# Patient Record
Sex: Male | Born: 1990
Health system: Southern US, Community
[De-identification: ages and names within clinical notes are randomized; demographics above are authoritative.]

## PROBLEM LIST (undated history)

## (undated) DIAGNOSIS — R002 Palpitations: Secondary | ICD-10-CM

## (undated) DIAGNOSIS — Q249 Congenital malformation of heart, unspecified: Secondary | ICD-10-CM

## (undated) DIAGNOSIS — K824 Cholesterolosis of gallbladder: Secondary | ICD-10-CM

## (undated) DIAGNOSIS — I10 Essential (primary) hypertension: Secondary | ICD-10-CM

## (undated) DIAGNOSIS — F419 Anxiety disorder, unspecified: Secondary | ICD-10-CM

## (undated) HISTORY — DX: Anxiety disorder, unspecified: F41.9

## (undated) HISTORY — DX: Congenital malformation of heart, unspecified: Q24.9

## (undated) HISTORY — DX: Cholesterolosis of gallbladder: K82.4

---

## 2008-09-05 ENCOUNTER — Emergency Department (HOSPITAL_BASED_OUTPATIENT_CLINIC_OR_DEPARTMENT_OTHER): Admission: EM | Admit: 2008-09-05 | Discharge: 2008-09-05 | Payer: Self-pay | Admitting: Emergency Medicine

## 2009-02-13 ENCOUNTER — Emergency Department (HOSPITAL_BASED_OUTPATIENT_CLINIC_OR_DEPARTMENT_OTHER): Admission: EM | Admit: 2009-02-13 | Discharge: 2009-02-14 | Payer: Self-pay | Admitting: Emergency Medicine

## 2010-11-18 ENCOUNTER — Emergency Department (HOSPITAL_BASED_OUTPATIENT_CLINIC_OR_DEPARTMENT_OTHER)
Admission: EM | Admit: 2010-11-18 | Discharge: 2010-11-18 | Disposition: A | Discharge status: Home / Self Care | Payer: No Typology Code available for payment source | Attending: Emergency Medicine | Admitting: Emergency Medicine

## 2010-11-18 ENCOUNTER — Encounter: Payer: Self-pay | Admitting: *Deleted

## 2010-11-18 DIAGNOSIS — M549 Dorsalgia, unspecified: Secondary | ICD-10-CM | POA: Insufficient documentation

## 2010-11-18 DIAGNOSIS — S39012A Strain of muscle, fascia and tendon of lower back, initial encounter: Secondary | ICD-10-CM

## 2010-11-18 DIAGNOSIS — Y92410 Unspecified street and highway as the place of occurrence of the external cause: Secondary | ICD-10-CM | POA: Insufficient documentation

## 2010-11-18 DIAGNOSIS — J45909 Unspecified asthma, uncomplicated: Secondary | ICD-10-CM | POA: Insufficient documentation

## 2010-11-18 MED ORDER — CYCLOBENZAPRINE HCL 10 MG PO TABS: 10.0000 mg | ORAL_TABLET | Freq: Two times a day (BID) | ORAL | Status: AC | PRN
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
## 2011-03-20 ENCOUNTER — Encounter (HOSPITAL_COMMUNITY): Payer: Self-pay | Admitting: Emergency Medicine

## 2011-03-20 ENCOUNTER — Emergency Department (HOSPITAL_COMMUNITY)
Admission: EM | Admit: 2011-03-20 | Discharge: 2011-03-21 | Disposition: A | Discharge status: Home / Self Care | Payer: BC Managed Care – PPO | Attending: Emergency Medicine | Admitting: Emergency Medicine

## 2011-03-20 ENCOUNTER — Other Ambulatory Visit: Payer: Self-pay

## 2011-03-20 DIAGNOSIS — R002 Palpitations: Secondary | ICD-10-CM | POA: Insufficient documentation

## 2011-03-20 DIAGNOSIS — R209 Unspecified disturbances of skin sensation: Secondary | ICD-10-CM | POA: Insufficient documentation

## 2011-03-20 DIAGNOSIS — R0602 Shortness of breath: Secondary | ICD-10-CM | POA: Insufficient documentation

## 2011-03-20 NOTE — ED Notes (Signed)
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

## 2011-03-20 NOTE — ED Notes (Signed)
Pt states he was watching tv and his breathing became shallow then his heart rate increased and he became clammy and he felt nauseated  Pt states EMS came out and assessed him and his blood pressure was elevated  Pt came in by POV   Pt states he feels better now but feels more tired than normal  Pt states he did eat tonight  EKG obtained in triage

## 2011-03-21 ENCOUNTER — Other Ambulatory Visit: Payer: Self-pay

## 2011-03-21 LAB — CBC
MCV: 82.4 fL (ref 78.0–100.0)
Platelets: 188 10*3/uL (ref 150–400)
RDW: 11.9 % (ref 11.5–15.5)
WBC: 10.5 10*3/uL (ref 4.0–10.5)

## 2011-03-21 LAB — BASIC METABOLIC PANEL
Calcium: 10 mg/dL (ref 8.4–10.5)
Chloride: 101 mEq/L (ref 96–112)
Creatinine, Ser: 0.98 mg/dL (ref 0.50–1.35)
GFR calc Af Amer: >90 mL/min (ref >90)
GFR calc non Af Amer: >90 mL/min (ref >90)

## 2011-03-21 LAB — RAPID URINE DRUG SCREEN, HOSP PERFORMED
Amphetamines: NOT DETECTED
Opiates: NOT DETECTED

## 2011-03-21 NOTE — ED Provider Notes (Signed)
History     CSN: [id number]  Arrival date & time 03/20/11  2239   First MD Initiated Contact with Patient 03/21/11 0231      No chief complaint on file.   (Consider location/radiation/quality/duration/timing/severity/associated sxs/prior treatment) The history is provided by the patient.   patient reports he was in his normal state health today laying on the couch watching TV when he developed sudden palpitations and a rapid heart rate.  He made him short of breath.  He also had sweating and numbness in his bilateral hands.  He does report significant use of caffeine which includes multiple caffeinated sodas daily as well as an organic energy during.  He does have a family history of early heart disease on his father's side with MIs in their mid 18s.  There is no family history of early death or sudden cardiac death.  At time of my evaluation the patient reports he feels completely back to normal.  He has no palpitations chest pain or shortness of breath at this time.  He has had no recent long travel.  He denies to lower leg swelling.  He does not smoke cigarettes.  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  The patient several palpitations before.  Past Medical History  Diagnosis Date  . Asthma     History reviewed. No pertinent past surgical history.  History reviewed. No pertinent family history.  History  Substance Use Topics  . Smoking status: Never Smoker   . Smokeless tobacco: Not on file  . Alcohol Use: Yes     occassional      Review of Systems  All other systems reviewed and are negative.    Allergies  Review of patient's allergies indicates no known allergies.  Home Medications   Current Outpatient Rx  Name Route Sig Dispense Refill  . ACETAMINOPHEN 325 MG PO TABS Oral Take 650 mg by mouth  every 6 (six) hours as needed. For pain    . ALBUTEROL SULFATE HFA 108 (90 BASE) MCG/ACT IN AERS Inhalation Inhale 2 puffs into the lungs every 6 (six) hours as needed. For asthma      BP 177/93  Pulse 142  Temp(Src) 98.5 F (36.9 C) (Oral)  Resp 18  SpO2 98%  Physical Exam  Nursing note and vitals reviewed. Constitutional: He is oriented to person, place, and time. He appears well-developed and well-nourished.  HENT:  Head: Normocephalic and atraumatic.  Eyes: EOM are normal.  Neck: Normal range of motion.  Cardiovascular: Normal rate, regular rhythm, normal heart sounds and intact distal pulses.   Pulmonary/Chest: Effort normal and breath sounds normal. No respiratory distress.  Abdominal: Soft. He exhibits no distension. There is no tenderness.  Musculoskeletal: Normal range of motion.  Neurological: He is alert and oriented to person, place, and time.  Skin: Skin is warm and dry.  Psychiatric: He has a normal mood and affect. Judgment normal.    ED Course  Procedures (including critical care time)   Date: 03/20/2011 2256  Rate: 100  Rhythm: Sinus tachycardia  QRS Axis: normal  Intervals: normal  ST/T Wave abnormalities: Nonspecific ST changes  Conduction Disutrbances: none  Narrative Interpretation:   Old EKG Reviewed: No prior EKG available   Date: 03/21/2011 0323  Rate: 69  Rhythm: normal sinus rhythm  QRS Axis: normal  Intervals: normal  ST/T Wave abnormalities: normal  Conduction Disutrbances: none  Narrative Interpretation:   Old EKG Reviewed: Sinus tachycardia is no longer present.    Labs Reviewed  CBC - Abnormal; Notable for the following:    MCHC 37.6 (*) RULED OUT INTERFERING SUBSTANCES   All other components within normal limits  BASIC METABOLIC PANEL - Abnormal; Notable for the following:    Glucose, Bld 125 (*)    All other components within normal limits  URINE RAPID DRUG SCREEN (HOSP PERFORMED)   No results found.   1. Palpitations         MDM  The patient presented with a heart rate of 142.  No EKG was obtained at this rate.  His only EKG obtained at a high rate with a rate of 100..  Nonspecific ST changes.  I suspect he may have had atrial fibrillation or atrial flutter although it is a very healthy young gentleman.  He does use a significant amount of caffeine daily and I suspect this is leading to his palpitations and may have resulted in atrial fibrillation or atrial flutter.  At time of my initial evaluation and throughout the remainder of his ER stay he remained in sinus rhythm.  His discharge heart rate was 69.Marland Kitchen  He has followup with a cardiologist scheduled for 2 days secondary to a possible murmur heard while in nursing school.  At this time I will hold placing the patient on calcium channel blocker or beta blocker and will defer this to cardiology.  She nothing to suggest Wolff-Parkinson-White syndrome.  Patient said no exertional syncope or exertional chest pain or shortness of breath.  He went for a run with his dog earlier today and had no difficulties        Lyanne Co, MD 03/21/11 [phone]

## 2011-04-05 ENCOUNTER — Other Ambulatory Visit (HOSPITAL_COMMUNITY): Payer: Self-pay | Admitting: Cardiovascular Disease

## 2011-04-05 DIAGNOSIS — R825 Elevated urine levels of drugs, medicaments and biological substances: Secondary | ICD-10-CM

## 2011-04-12 ENCOUNTER — Other Ambulatory Visit (HOSPITAL_COMMUNITY): Payer: Self-pay | Admitting: Cardiovascular Disease

## 2011-04-12 ENCOUNTER — Ambulatory Visit (HOSPITAL_COMMUNITY)
Admission: RE | Admit: 2011-04-12 | Discharge: 2011-04-12 | Disposition: A | Discharge status: Home / Self Care | Payer: BC Managed Care – PPO | Source: Ambulatory Visit | Attending: Cardiovascular Disease | Admitting: Cardiovascular Disease

## 2011-04-12 ENCOUNTER — Encounter (HOSPITAL_COMMUNITY): Payer: Self-pay

## 2011-04-12 DIAGNOSIS — R82998 Other abnormal findings in urine: Secondary | ICD-10-CM | POA: Insufficient documentation

## 2011-04-12 DIAGNOSIS — R825 Elevated urine levels of drugs, medicaments and biological substances: Secondary | ICD-10-CM

## 2011-04-12 DIAGNOSIS — K824 Cholesterolosis of gallbladder: Secondary | ICD-10-CM | POA: Insufficient documentation

## 2011-04-15 ENCOUNTER — Encounter (HOSPITAL_BASED_OUTPATIENT_CLINIC_OR_DEPARTMENT_OTHER): Payer: Self-pay | Admitting: *Deleted

## 2011-04-15 ENCOUNTER — Emergency Department (HOSPITAL_BASED_OUTPATIENT_CLINIC_OR_DEPARTMENT_OTHER)
Admission: EM | Admit: 2011-04-15 | Discharge: 2011-04-15 | Disposition: A | Discharge status: Home / Self Care | Payer: BC Managed Care – PPO | Attending: Emergency Medicine | Admitting: Emergency Medicine

## 2011-04-15 DIAGNOSIS — W57XXXA Bitten or stung by nonvenomous insect and other nonvenomous arthropods, initial encounter: Secondary | ICD-10-CM | POA: Insufficient documentation

## 2011-04-15 DIAGNOSIS — S40269A Insect bite (nonvenomous) of unspecified shoulder, initial encounter: Secondary | ICD-10-CM | POA: Insufficient documentation

## 2011-04-15 DIAGNOSIS — IMO0002 Reserved for concepts with insufficient information to code with codable children: Secondary | ICD-10-CM

## 2011-04-15 DIAGNOSIS — I10 Essential (primary) hypertension: Secondary | ICD-10-CM | POA: Insufficient documentation

## 2011-04-15 HISTORY — DX: Essential (primary) hypertension: I10

## 2011-04-15 HISTORY — DX: Palpitations: R00.2

## 2011-04-15 NOTE — Discharge Instructions (Signed)
Insect Bite Mosquitoes, flies, fleas, bedbugs, and many other insects can bite. Insect bites are different from insect stings. A sting is when venom is injected into the skin. Some insect bites can transmit infectious diseases. SYMPTOMS  Insect bites usually turn red, swell, and itch for 2 to 4 days. They often go away on their own. TREATMENT  Your caregiver may prescribe antibiotic medicines if a bacterial infection develops in the bite. HOME CARE INSTRUCTIONS  Do not scratch the bite area.   Keep the bite area clean and dry. Wash the bite area thoroughly with soap and water.   Put ice or cool compresses on the bite area.   Put ice in a plastic bag.   Place a towel between your skin and the bag.   Leave the ice on for 20 minutes, 4 times a day for the first 2 to 3 days, or as directed.   You may apply a baking soda paste, cortisone cream, or calamine lotion to the bite area as directed by your caregiver. This can help reduce itching and swelling.   Only take over-the-counter or prescription medicines as directed by your caregiver.   If you are given antibiotics, take them as directed. Finish them even if you start to feel better.  You may need a tetanus shot if:  You cannot remember when you had your last tetanus shot.   You have never had a tetanus shot.   The injury broke your skin.  If you get a tetanus shot, your arm may swell, get red, and feel warm to the touch. This is common and not a problem. If you need a tetanus shot and you choose not to have one, there is a rare chance of getting tetanus. Sickness from tetanus can be serious. SEEK IMMEDIATE MEDICAL CARE IF:   You have increased pain, redness, or swelling in the bite area.   You see a red line on the skin coming from the bite.   You have a fever.   You have joint pain.   You have a headache or neck pain.   You have unusual weakness.   You have a rash.   You have chest pain or shortness of breath.   You  have abdominal pain, nausea, or vomiting.   You feel unusually tired or sleepy.  MAKE SURE YOU:   Understand these instructions.   Will watch your condition.   Will get help right away if you are not doing well or get worse.  Document Released: 02/03/2004 Document Revised: 12/15/2010 Document Reviewed: 07/27/2010 Exit[employer] Patient Information 2012 [employer]. 

## 2011-04-15 NOTE — ED Notes (Addendum)
Fine red rash to right upper arm onset today. Was outside yesterday. States has had some neck discomfort and tension headaches for a couple weeks.

## 2011-04-15 NOTE — ED Provider Notes (Signed)
History     CSN: [id number]  Arrival date & time 04/15/11  2106   First MD Initiated Contact with Patient 04/15/11 2142      Chief Complaint  Patient presents with  . Rash    (Consider location/radiation/quality/duration/timing/severity/associated sxs/prior treatment) Patient is a 21 y.o. male presenting with rash. The history is provided by the patient. No language interpreter was used.  Rash  This is a new problem. The current episode started 6 to 12 hours ago. The problem has been gradually worsening. The problem is associated with an insect bite/sting. There has been no fever. The rash is present on the right arm. The patient is experiencing no pain. The pain has been constant since onset. He has tried nothing for the symptoms. The treatment provided no relief. Risk factors include new medications.  Pt reports he had a bump come up on his arm.  Pt thinks it may be an insect bite.  Pt did not see a tick  Past Medical History  Diagnosis Date  . Asthma   . Hypertension   . Palpitations     History reviewed. No pertinent past surgical history.  History reviewed. No pertinent family history.  History  Substance Use Topics  . Smoking status: Never Smoker   . Smokeless tobacco: Not on file  . Alcohol Use: Yes     occassional      Review of Systems  Skin: Positive for rash.  All other systems reviewed and are negative.    Allergies  Review of patient's allergies indicates no known allergies.  Home Medications   Current Outpatient Rx  Name Route Sig Dispense Refill  . ACETAMINOPHEN 325 MG PO TABS Oral Take 650 mg by mouth every 6 (six) hours as needed. For pain    . BUSPIRONE HCL 5 MG PO TABS Oral Take 5 mg by mouth 3 (three) times daily.    Marland Kitchen METOPROLOL SUCCINATE ER 25 MG PO TB24 Oral Take 25 mg by mouth daily.      BP 144/83  Pulse 56  Temp(Src) 97.9 F (36.6 C) (Oral)  Resp 18  Ht 5\' 4"  (1.626 m)  Wt 120 lb (54.432 kg)  BMI 20.60 kg/m2  SpO2  99%  Physical Exam  Constitutional: He appears well-developed and well-nourished.  HENT:  Head: Normocephalic and atraumatic.  Mouth/Throat: Oropharynx is clear and moist.  Eyes: Conjunctivae are normal. Pupils are equal, round, and reactive to light.  Neck: Normal range of motion.  Pulmonary/Chest: Effort normal.  Abdominal: Soft.  Musculoskeletal: Normal range of motion.       1cm round area looks like a bug bite  Neurological: He is alert.  Skin: There is erythema.    ED Course  Procedures (including critical care time)  Labs Reviewed - No data to display No results found.   1. Bug bite of shoulder or upper arm       MDM  Pt counseled on need to return if fever, headache or other sign of tick illness.   Topical hydrocortisone to area        Lonia Skinner McIntosh, [place] 04/15/11 2220

## 2011-04-17 NOTE — ED Provider Notes (Signed)
History/physical exam/procedure(s) were performed by non-physician practitioner and as supervising physician I was immediately available for consultation/collaboration. I have reviewed all notes and am in agreement with care and plan.   Hilario Quarry, MD 04/17/11 1323

## 2011-05-15 ENCOUNTER — Encounter: Payer: Self-pay | Admitting: Family Medicine

## 2011-05-15 ENCOUNTER — Ambulatory Visit (INDEPENDENT_AMBULATORY_CARE_PROVIDER_SITE_OTHER): Payer: No Typology Code available for payment source | Admitting: Family Medicine

## 2011-05-15 VITALS — BP 115/75 | HR 114 | Temp 99.0°F | Ht 65.0 in | Wt 117.8 lb

## 2011-05-15 DIAGNOSIS — F419 Anxiety disorder, unspecified: Secondary | ICD-10-CM

## 2011-05-15 DIAGNOSIS — F411 Generalized anxiety disorder: Secondary | ICD-10-CM

## 2011-05-15 DIAGNOSIS — R825 Elevated urine levels of drugs, medicaments and biological substances: Secondary | ICD-10-CM | POA: Insufficient documentation

## 2011-05-15 DIAGNOSIS — R634 Abnormal weight loss: Secondary | ICD-10-CM | POA: Insufficient documentation

## 2011-05-15 DIAGNOSIS — J45909 Unspecified asthma, uncomplicated: Secondary | ICD-10-CM

## 2011-05-15 DIAGNOSIS — R82998 Other abnormal findings in urine: Secondary | ICD-10-CM

## 2011-05-15 MED ORDER — BUSPIRONE HCL 15 MG PO TABS: ORAL_TABLET | ORAL | Status: DC

## 2011-05-15 NOTE — Progress Notes (Signed)
  Subjective:    Patient ID: Miguel Love, male    DOB: [birth year], 21 y.o.   MRN: [id number]  HPI New to establish.  Previous MD- Donnie Coffin (Peds), Stefan Church (Allergist- retired).  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  Not on daily controller med.  Doing well.  Arrhythmia- had episode of SOB and racing heart over spring break.  Went to ER.  EKG was normal.  Stopped caffeine intake.  Had normal ECHO.  Prior to this had no hx of anxiety or panic attacks.  Has not had an episode for last 3 weeks.  Started Metoprolol beginning of April.  Had exercise stress test that showed elevated metanephrines.  Had w/u for pheo- negative.  Has upcoming appt w/ Endo ([facility])  Anxiety- reports he recently has been having muscle spasms and twitches.  Started w/ big toe last week.  Recently gave up caffeine.  Started on Buspar by Dr Alanda Amass.   Review of Systems + losing weight, otherwise see HPI    Objective:   Physical Exam  Vitals reviewed. Constitutional: He is oriented to person, place, and time. He appears well-developed and well-nourished. No distress.  HENT:  Head: Normocephalic and atraumatic.  Eyes: Conjunctivae and EOM are normal. Pupils are equal, round, and reactive to light.  Neck: Normal range of motion. Neck supple. No thyromegaly present.  Cardiovascular: Normal rate, regular rhythm, normal heart sounds and intact distal pulses.   No murmur heard. Pulmonary/Chest: Effort normal and breath sounds normal. No respiratory distress.  Abdominal: Soft. Bowel sounds are normal. He exhibits no distension.  Musculoskeletal: He exhibits no edema.  Lymphadenopathy:    He has no cervical adenopathy.  Neurological: He is alert and oriented to person, place, and time. No cranial nerve deficit.  Skin: Skin is warm and dry.  Psychiatric: His behavior is normal.   Anxious, pressured speech          Assessment & Plan:

## 2011-05-15 NOTE — Patient Instructions (Signed)
Schedule your complete physical in 4-6 weeks Have Dr Talmage Nap send me copies of her notes/lab results Increase the Buspar as directed Call with any questions or concerns Welcome!  We're glad to have you! Happy Early [place] Ouch!

## 2011-05-18 ENCOUNTER — Ambulatory Visit: Payer: No Typology Code available for payment source | Admitting: Internal Medicine

## 2011-05-23 NOTE — Assessment & Plan Note (Signed)
New.  Found by cards at w/u for tachycardia.  Has appt w/ Endo upcoming.  Encouraged f/u as scheduled.

## 2011-05-23 NOTE — Assessment & Plan Note (Signed)
New.  Pt is small in size so any weight loss is noticeable/considerable.  Will defer blood work to endo at upcoming appt.  Will continue to follow.

## 2011-05-23 NOTE — Assessment & Plan Note (Signed)
New.  Unclear whether this is due to pt's elevated metanephrine levels.  Will increase Buspar to more therapeutic dose.  Pt given names and #s of therapists.  Will follow closely.

## 2011-05-23 NOTE — Assessment & Plan Note (Signed)
New.  Pt has mostly outgrown this.  Not requiring meds.  Will follow and remember this when pt presents w/ URI sxs.

## 2011-06-08 ENCOUNTER — Other Ambulatory Visit (HOSPITAL_COMMUNITY): Payer: Self-pay | Admitting: Endocrinology

## 2011-06-08 DIAGNOSIS — D3500 Benign neoplasm of unspecified adrenal gland: Secondary | ICD-10-CM

## 2011-06-09 ENCOUNTER — Encounter: Payer: Self-pay | Admitting: *Deleted

## 2011-06-09 ENCOUNTER — Ambulatory Visit (INDEPENDENT_AMBULATORY_CARE_PROVIDER_SITE_OTHER): Payer: BC Managed Care – PPO | Admitting: *Deleted

## 2011-06-09 DIAGNOSIS — Z23 Encounter for immunization: Secondary | ICD-10-CM

## 2011-06-09 DIAGNOSIS — Z2911 Encounter for prophylactic immunotherapy for respiratory syncytial virus (RSV): Secondary | ICD-10-CM

## 2011-06-09 DIAGNOSIS — Z111 Encounter for screening for respiratory tuberculosis: Secondary | ICD-10-CM

## 2011-06-09 MED ORDER — TETANUS-DIPHTH-ACELL PERTUSSIS 5-2.5-18.5 LF-MCG/0.5 IM SUSP
0.5000 mL | Freq: Once | INTRAMUSCULAR | Status: AC
  Administered 2011-06-09: 0.5 mL via INTRAMUSCULAR

## 2011-06-12 LAB — TB SKIN TEST: Induration: 3 mm

## 2011-06-13 ENCOUNTER — Ambulatory Visit (HOSPITAL_COMMUNITY): Payer: No Typology Code available for payment source

## 2011-06-13 ENCOUNTER — Encounter (HOSPITAL_COMMUNITY)
Admission: RE | Admit: 2011-06-13 | Discharge: 2011-06-13 | Disposition: A | Discharge status: Home / Self Care | Payer: BC Managed Care – PPO | Source: Ambulatory Visit | Attending: Endocrinology | Admitting: Endocrinology

## 2011-06-13 DIAGNOSIS — D3500 Benign neoplasm of unspecified adrenal gland: Secondary | ICD-10-CM

## 2011-06-14 ENCOUNTER — Encounter (HOSPITAL_COMMUNITY)
Admission: RE | Admit: 2011-06-14 | Discharge: 2011-06-14 | Disposition: A | Discharge status: Home / Self Care | Payer: BC Managed Care – PPO | Source: Ambulatory Visit | Attending: Endocrinology | Admitting: Endocrinology

## 2011-06-14 MED ORDER — IOBENGUANE SULFATE I 123 10 MCI/5ML IV SOLN: 10.0000 | Freq: Once | INTRAVENOUS | Status: AC | PRN

## 2011-06-16 ENCOUNTER — Ambulatory Visit (INDEPENDENT_AMBULATORY_CARE_PROVIDER_SITE_OTHER): Payer: BC Managed Care – PPO | Admitting: *Deleted

## 2011-06-16 ENCOUNTER — Encounter: Payer: Self-pay | Admitting: *Deleted

## 2011-06-16 DIAGNOSIS — Z111 Encounter for screening for respiratory tuberculosis: Secondary | ICD-10-CM

## 2011-06-19 ENCOUNTER — Telehealth: Payer: Self-pay | Admitting: *Deleted

## 2011-06-19 LAB — TB SKIN TEST
Induration: 3 mm
TB Skin Test: NEGATIVE

## 2011-06-19 NOTE — Telephone Encounter (Signed)
Pt came into office to have second PPD read and asked if MD Beverely Low had gotten the results of his recent NM Tumor Localization Multiple per pt called MD office that ordered test however no response at this time and wanted to make sure MD Beverely Low does have access, advised that I would let MD Tabori know, pt scheduled for a CPE in july

## 2011-06-19 NOTE — Telephone Encounter (Signed)
Did view results of scan.  Pt should f/u w/ ordering MD to determine next steps (additional imaging, watchful waiting, etc)

## 2011-06-20 NOTE — Telephone Encounter (Signed)
Discuss with patient who states that he did speak with ENDO today and results were normal and we should be receiving a copy within a few days.

## 2011-06-20 NOTE — Telephone Encounter (Signed)
.  left message to have patient return my call on both home and cell number

## 2011-06-29 ENCOUNTER — Encounter: Payer: Self-pay | Admitting: Family Medicine

## 2011-06-29 ENCOUNTER — Ambulatory Visit (INDEPENDENT_AMBULATORY_CARE_PROVIDER_SITE_OTHER): Payer: BC Managed Care – PPO | Admitting: Family Medicine

## 2011-06-29 VITALS — BP 123/78 | HR 105 | Temp 99.8°F | Ht 65.0 in | Wt 120.8 lb

## 2011-06-29 DIAGNOSIS — F411 Generalized anxiety disorder: Secondary | ICD-10-CM

## 2011-06-29 DIAGNOSIS — Z0000 Encounter for general adult medical examination without abnormal findings: Secondary | ICD-10-CM | POA: Insufficient documentation

## 2011-06-29 DIAGNOSIS — F419 Anxiety disorder, unspecified: Secondary | ICD-10-CM

## 2011-06-29 MED ORDER — SERTRALINE HCL 50 MG PO TABS: 50.0000 mg | ORAL_TABLET | Freq: Every day | ORAL | Status: DC

## 2011-06-29 NOTE — Patient Instructions (Addendum)
Follow up in 6 weeks to check anxiety Start the Zoloft at dinner- start w/ 1/2 tab x1 week and then increase to whole tab Continue the Buspar as is for 1st week, decrease to 1/2 tab of buspar when you increase the Zoloft Call with any questions or concerns Your physical looks great! Have a great summer!!!

## 2011-06-29 NOTE — Assessment & Plan Note (Signed)
New.  Pt's PE WNL.  Recently had labs done w/ endo.  Forms completed for school.  Anticipatory guidance provided.

## 2011-06-29 NOTE — Progress Notes (Signed)
  Subjective:    Patient ID: Miguel Love, male    DOB: 02-07-1990, 21 y.o.   MRN: [id number]  HPI CPE- following w/ endo for elevated catecholamines.  Still having some anxiety and palpitations.  Will have palpitations and then think, 'what's wrong w/ me?  Am i dying?'.  Admits that thought process will spiral and he will think the worst- which worsens palpitations and SOB.  Buspar is helping but causes drowsiness.   Review of Systems Patient reports no vision/hearing changes, anorexia, fever,adenopathy, persistant/recurrent hoarseness, swallowing issues, edema, persistant/recurrent cough, hemoptysis, dyspnea (rest,exertional, paroxysmal nocturnal), gastrointestinal  bleeding (melena, rectal bleeding), abdominal pain, excessive heart burn, GU symptoms (dysuria, hematuria, voiding/incontinence issues) syncope, focal weakness, memory loss, numbness & tingling, skin/hair/nail changes, depression, abnormal bruising/bleeding, musculoskeletal symptoms/signs.     Objective:   Physical Exam BP 123/78  Pulse 105  Temp 99.8 F (37.7 C) (Oral)  Ht 5\' 5"  (1.651 m)  Wt 120 lb 12.8 oz (54.795 kg)  BMI 20.10 kg/m2  SpO2 97%  General Appearance:    Alert, cooperative, no distress, appears stated age  Head:    Normocephalic, without obvious abnormality, atraumatic  Eyes:    PERRL, conjunctiva/corneas clear, EOM's intact, fundi    benign, both eyes       Ears:    Normal TM's and external ear canals, both ears  Nose:   Nares normal, septum midline, mucosa normal, no drainage   or sinus tenderness  Throat:   Lips, mucosa, and tongue normal; teeth and gums normal  Neck:   Supple, symmetrical, trachea midline, no adenopathy;       thyroid:  No enlargement/tenderness/nodules  Back:     Symmetric, no curvature, ROM normal, no CVA tenderness  Lungs:     Clear to auscultation bilaterally, respirations unlabored  Chest wall:    No tenderness or deformity  Heart:    Regular rate and rhythm, S1 and S2 normal,  no murmur, rub   or gallop  Abdomen:     Soft, non-tender, bowel sounds active all four quadrants,    no masses, no organomegaly  Genitalia:    Normal male without lesion, discharge or tenderness  Rectal:    Deferred  Extremities:   Extremities normal, atraumatic, no cyanosis or edema  Pulses:   2+ and symmetric all extremities  Skin:   Skin color, texture, turgor normal, no rashes or lesions  Lymph nodes:   Cervical, supraclavicular, and axillary nodes normal  Neurologic:   CNII-XII intact. Normal strength, sensation and reflexes      throughout          Assessment & Plan:

## 2011-06-29 NOTE — Assessment & Plan Note (Signed)
Unchanged.  buspar somewhat effective but unable to titrate dose due to drowsiness.  Start daily SSRI as controller med.  Will follow closely.

## 2011-07-27 ENCOUNTER — Ambulatory Visit (INDEPENDENT_AMBULATORY_CARE_PROVIDER_SITE_OTHER): Payer: BC Managed Care – PPO | Admitting: *Deleted

## 2011-07-27 DIAGNOSIS — Z23 Encounter for immunization: Secondary | ICD-10-CM

## 2011-11-07 ENCOUNTER — Other Ambulatory Visit: Payer: Self-pay

## 2011-11-07 MED ORDER — SERTRALINE HCL 50 MG PO TABS: 50.0000 mg | ORAL_TABLET | Freq: Every day | ORAL | Status: DC

## 2011-11-07 NOTE — Telephone Encounter (Signed)
Rx sent pt aware and verified pharmacy.   MW  

## 2012-01-22 ENCOUNTER — Other Ambulatory Visit: Payer: Self-pay | Admitting: Family Medicine

## 2012-01-22 NOTE — Telephone Encounter (Signed)
Please advise on RF request.  Last OV:06-29-11.//AB/CMA

## 2012-01-22 NOTE — Telephone Encounter (Signed)
Ok for #30, 6 refills 

## 2012-01-22 NOTE — Telephone Encounter (Signed)
refill Sertraline HCl (Tab) 50 MG Take 1 tablet (50 mg total) by mouth daily. #30 last fill 12.7.13

## 2012-01-23 MED ORDER — SERTRALINE HCL 50 MG PO TABS: 50.0000 mg | ORAL_TABLET | Freq: Every day | ORAL | Status: DC

## 2012-01-23 NOTE — Telephone Encounter (Signed)
Rx sent to the pharmacy by e-script.//AB/CMA 

## 2012-09-18 ENCOUNTER — Telehealth: Payer: Self-pay | Admitting: General Practice

## 2012-09-18 MED ORDER — BUSPIRONE HCL 15 MG PO TABS: ORAL_TABLET | ORAL | Status: DC

## 2012-09-18 MED ORDER — SERTRALINE HCL 50 MG PO TABS: 50.0000 mg | ORAL_TABLET | Freq: Every day | ORAL | Status: DC

## 2012-09-18 NOTE — Telephone Encounter (Signed)
Meds filled. Called pt. appts made.

## 2012-09-18 NOTE — Telephone Encounter (Signed)
Pt called for medication refill.  Last OV 06-29-11 busPIRone (BUSPAR) 15 MG tablet 05-15-2011 #60 with 3 refills sertraline (ZOLOFT) 50 MG tablet 01-23-12 #30 with 6 refills

## 2012-09-18 NOTE — Telephone Encounter (Signed)
Ok for #30 of each- has not been seen in the office in over a year and can not continue to refill w/o appt

## 2012-10-04 ENCOUNTER — Ambulatory Visit (INDEPENDENT_AMBULATORY_CARE_PROVIDER_SITE_OTHER): Payer: BC Managed Care – PPO | Admitting: Family Medicine

## 2012-10-04 ENCOUNTER — Encounter: Payer: Self-pay | Admitting: Family Medicine

## 2012-10-04 VITALS — BP 120/80 | HR 72 | Temp 98.0°F | Wt 133.6 lb

## 2012-10-04 DIAGNOSIS — F411 Generalized anxiety disorder: Secondary | ICD-10-CM

## 2012-10-04 DIAGNOSIS — I10 Essential (primary) hypertension: Secondary | ICD-10-CM | POA: Insufficient documentation

## 2012-10-04 DIAGNOSIS — F419 Anxiety disorder, unspecified: Secondary | ICD-10-CM

## 2012-10-04 NOTE — Assessment & Plan Note (Signed)
Well controlled.  Asymptomatic.  Tolerating meds w/out difficulty.  Will follow.

## 2012-10-04 NOTE — Patient Instructions (Signed)
Schedule your complete physical in 6 months You look great!  Keep up the good work! Call with any questions or concerns Happy Fall!!!

## 2012-10-04 NOTE — Progress Notes (Signed)
  Subjective:    Patient ID: Miguel Love, male    DOB: 12-24-1990, 24 y.o.   MRN: [id number]  HPI Anxiety- well controlled on Buspar and Zoloft.  Sleeping well.  No panic attacks.  HTN- chronic problem, on Toprol XL daily.  No CP, SOB, HAs, visual changes, edema.   Review of Systems For ROS see HPI     Objective:   Physical Exam  Vitals reviewed. Constitutional: He is oriented to person, place, and time. He appears well-developed and well-nourished. No distress.  HENT:  Head: Normocephalic and atraumatic.  Eyes: Conjunctivae and EOM are normal. Pupils are equal, round, and reactive to light.  Neck: Normal range of motion. Neck supple. No thyromegaly present.  Cardiovascular: Normal rate, regular rhythm, normal heart sounds and intact distal pulses.   No murmur heard. Pulmonary/Chest: Effort normal and breath sounds normal. No respiratory distress.  Abdominal: Soft. Bowel sounds are normal. He exhibits no distension.  Musculoskeletal: He exhibits no edema.  Lymphadenopathy:    He has no cervical adenopathy.  Neurological: He is alert and oriented to person, place, and time. No cranial nerve deficit.  Skin: Skin is warm and dry.  Psychiatric: He has a normal mood and affect. His behavior is normal.          Assessment & Plan:

## 2012-10-04 NOTE — Assessment & Plan Note (Signed)
New to provider.  Was following w/ Dr Alanda Amass but he is retiring.  Asking me to assume responsibility for Metoprolol.  Will now manage HTN.

## 2012-10-16 ENCOUNTER — Other Ambulatory Visit: Payer: Self-pay | Admitting: Family Medicine

## 2012-10-16 NOTE — Telephone Encounter (Signed)
Pt last seen 10-04-12 Meds last filled 09-18-12 both for 30 days.

## 2012-10-17 NOTE — Telephone Encounter (Signed)
Med filled.  

## 2012-10-25 ENCOUNTER — Telehealth: Payer: Self-pay | Admitting: Family Medicine

## 2012-10-25 MED ORDER — METOPROLOL SUCCINATE ER 25 MG PO TB24: 25.0000 mg | ORAL_TABLET | Freq: Every day | ORAL | Status: DC

## 2012-10-25 NOTE — Telephone Encounter (Signed)
Med filled.  

## 2012-10-25 NOTE — Telephone Encounter (Signed)
10/25/2012  Pt called.  He states he wants to change his PCP to [facility].  He was seen 10/04/12 by Beverely Low.  He states he takes 2 medications that we did not have on his medication list.  He wants Dr Beverely Low to take care of all his medications and refills.  Pt wants someone to call him to discuss his medications.

## 2012-10-25 NOTE — Telephone Encounter (Signed)
Patient called back and stated that he needs refill on metoprolol succinate (TOPROL-XL) 25 MG 24 hr tablet  Pharmacy  Pharmacy: [id number] - [place], [place] - [address] AT [address] OF [facility] [address]

## 2012-10-25 NOTE — Telephone Encounter (Signed)
I am his PCP.  Please call him for the medications he needs- i think we discussed that i would prescribe his BP meds at his last visit

## 2012-11-12 ENCOUNTER — Telehealth: Payer: Self-pay

## 2012-11-12 NOTE — Telephone Encounter (Signed)
Medication List and allergies: reviewed and updated  90 day supply/mail order: na Local prescriptions: [facility] [address] [facility]  Immunizations due: UTD  A/P:   No changes to [facility] or [facility] Received flu vaccine at work 09/2012; HM updated Patient expressed that he liked getting a call before his visit  To Discuss with Provider: Not at this time

## 2012-11-12 NOTE — Telephone Encounter (Signed)
Left message for call back Non identifiable  

## 2012-11-14 ENCOUNTER — Ambulatory Visit (INDEPENDENT_AMBULATORY_CARE_PROVIDER_SITE_OTHER): Payer: BC Managed Care – PPO | Admitting: Family Medicine

## 2012-11-14 ENCOUNTER — Other Ambulatory Visit: Payer: Self-pay | Admitting: Family Medicine

## 2012-11-14 ENCOUNTER — Encounter: Payer: Self-pay | Admitting: Family Medicine

## 2012-11-14 VITALS — BP 116/78 | HR 58 | Temp 98.2°F | Resp 16 | Ht 66.5 in | Wt 132.0 lb

## 2012-11-14 DIAGNOSIS — Z0000 Encounter for general adult medical examination without abnormal findings: Secondary | ICD-10-CM

## 2012-11-14 DIAGNOSIS — R7989 Other specified abnormal findings of blood chemistry: Secondary | ICD-10-CM

## 2012-11-14 LAB — CBC WITH DIFFERENTIAL/PLATELET
Basophils Relative: 0.7 % (ref 0.0–3.0)
Eosinophils Relative: 3.1 % (ref 0.0–5.0)
Lymphocytes Relative: 40.2 % (ref 12.0–46.0)
MCV: 87.7 fl (ref 78.0–100.0)
Monocytes Relative: 8.1 % (ref 3.0–12.0)
Neutrophils Relative %: 47.9 % (ref 43.0–77.0)
Platelets: 199 10*3/uL (ref 150.0–400.0)
RBC: 5.56 Mil/uL (ref 4.22–5.81)
WBC: 4.8 10*3/uL (ref 4.5–10.5)

## 2012-11-14 LAB — HEPATIC FUNCTION PANEL
ALT: 95 U/L — ABNORMAL HIGH (ref 0–53)
AST: 50 U/L — ABNORMAL HIGH (ref 0–37)
Albumin: 4.7 g/dL (ref 3.5–5.2)

## 2012-11-14 LAB — LIPID PANEL
HDL: 67.9 mg/dL (ref >39.00)
LDL Cholesterol: 73 mg/dL (ref 0–99)
Total CHOL/HDL Ratio: 2
VLDL: 10 mg/dL (ref 0.0–40.0)

## 2012-11-14 LAB — BASIC METABOLIC PANEL
BUN: 13 mg/dL (ref 6–23)
CO2: 27 mEq/L (ref 19–32)
Chloride: 103 mEq/L (ref 96–112)
Glucose, Bld: 108 mg/dL — ABNORMAL HIGH (ref 70–99)
Potassium: 3.6 mEq/L (ref 3.5–5.1)

## 2012-11-14 LAB — TSH: TSH: 0.97 u[IU]/mL (ref 0.35–5.50)

## 2012-11-14 NOTE — Patient Instructions (Signed)
Schedule your complete physical in 1 year- sooner if needed Keep up the good work!  You look great! We'll notify you of your lab results and make any changes if needed Call with any questions or concerns Happy Holidays!

## 2012-11-14 NOTE — Progress Notes (Signed)
  Subjective:    Patient ID: Miguel Love, male    DOB: June 17, 1990, 22 y.o.   MRN: [id number]  HPI CPE- no concerns.   Review of Systems Patient reports no vision/hearing changes, anorexia, fever ,adenopathy, persistant/recurrent hoarseness, swallowing issues, chest pain, palpitations, edema, persistant/recurrent cough, hemoptysis, dyspnea (rest,exertional, paroxysmal nocturnal), gastrointestinal  bleeding (melena, rectal bleeding), abdominal pain, excessive heart burn, GU symptoms (dysuria, hematuria, voiding/incontinence issues) syncope, focal weakness, memory loss, numbness & tingling, skin/hair/nail changes, depression, anxiety, abnormal bruising/bleeding, musculoskeletal symptoms/signs.     Objective:   Physical Exam BP 116/78  Pulse 58  Temp(Src) 98.2 F (36.8 C) (Oral)  Resp 16  Ht 5' 6.5" (1.689 m)  Wt 132 lb (59.875 kg)  BMI 20.99 kg/m2  SpO2 96%  General Appearance:    Alert, cooperative, no distress, appears stated age  Head:    Normocephalic, without obvious abnormality, atraumatic  Eyes:    PERRL, conjunctiva/corneas clear, EOM's intact, fundi    benign, both eyes       Ears:    Normal TM's and external ear canals, both ears  Nose:   Nares normal, septum midline, mucosa normal, no drainage   or sinus tenderness  Throat:   Lips, mucosa, and tongue normal; teeth and gums normal  Neck:   Supple, symmetrical, trachea midline, no adenopathy;       thyroid:  No enlargement/tenderness/nodules  Back:     Symmetric, no curvature, ROM normal, no CVA tenderness  Lungs:     Clear to auscultation bilaterally, respirations unlabored  Chest wall:    No tenderness or deformity  Heart:    Regular rate and rhythm, S1 and S2 normal, no murmur, rub   or gallop  Abdomen:     Soft, non-tender, bowel sounds active all four quadrants,    no masses, no organomegaly  Genitalia:    Normal male without lesion, discharge or tenderness  Rectal:      Extremities:   Extremities normal,  atraumatic, no cyanosis or edema  Pulses:   2+ and symmetric all extremities  Skin:   Skin color, texture, turgor normal, no rashes or lesions  Lymph nodes:   Cervical, supraclavicular, and axillary nodes normal  Neurologic:   CNII-XII intact. Normal strength, sensation and reflexes      throughout          Assessment & Plan:

## 2012-11-14 NOTE — Assessment & Plan Note (Signed)
Pt's PE WNL.  Check labs.  Anticipatory guidance provided.  

## 2012-11-28 ENCOUNTER — Other Ambulatory Visit: Payer: BC Managed Care – PPO

## 2012-11-29 ENCOUNTER — Other Ambulatory Visit: Payer: Self-pay | Admitting: Family Medicine

## 2012-11-29 ENCOUNTER — Other Ambulatory Visit (INDEPENDENT_AMBULATORY_CARE_PROVIDER_SITE_OTHER): Payer: BC Managed Care – PPO

## 2012-11-29 DIAGNOSIS — R7989 Other specified abnormal findings of blood chemistry: Secondary | ICD-10-CM

## 2012-11-29 LAB — HEPATIC FUNCTION PANEL
ALT: 93 U/L — ABNORMAL HIGH (ref 0–53)
AST: 67 U/L — ABNORMAL HIGH (ref 0–37)
Bilirubin, Direct: 0.1 mg/dL (ref 0.0–0.3)
Total Bilirubin: 0.8 mg/dL (ref 0.3–1.2)

## 2012-12-02 ENCOUNTER — Telehealth: Payer: Self-pay | Admitting: Family Medicine

## 2012-12-02 ENCOUNTER — Encounter: Payer: Self-pay | Admitting: Gastroenterology

## 2012-12-02 DIAGNOSIS — R945 Abnormal results of liver function studies: Secondary | ICD-10-CM

## 2012-12-02 DIAGNOSIS — R1011 Right upper quadrant pain: Secondary | ICD-10-CM

## 2012-12-02 DIAGNOSIS — R7989 Other specified abnormal findings of blood chemistry: Secondary | ICD-10-CM

## 2012-12-02 NOTE — Telephone Encounter (Signed)
Patient is calling and states that since this weekend he has started to experience pain in his RT side and upper RT back. Wants to know if he should be concerned considering his recent lab result. Please advise.

## 2012-12-02 NOTE — Telephone Encounter (Signed)
Pt notified. Can you please schedule?

## 2012-12-02 NOTE — Telephone Encounter (Signed)
He may be having gallstones- this can cause liver function elevation and also pain in RUQ that also usually involves R shoulder, typically after eating.  Usually w/ nausea and vomiting.  Can get abd [place] to look for gallstones but if pain continues, will need OV

## 2012-12-02 NOTE — Telephone Encounter (Signed)
Order placed and pt notified.

## 2012-12-03 ENCOUNTER — Ambulatory Visit
Admission: RE | Admit: 2012-12-03 | Discharge: 2012-12-03 | Disposition: A | Discharge status: Home / Self Care | Payer: BC Managed Care – PPO | Source: Ambulatory Visit | Attending: Family Medicine | Admitting: Family Medicine

## 2012-12-03 DIAGNOSIS — R1011 Right upper quadrant pain: Secondary | ICD-10-CM

## 2012-12-03 DIAGNOSIS — R7989 Other specified abnormal findings of blood chemistry: Secondary | ICD-10-CM

## 2012-12-03 DIAGNOSIS — R945 Abnormal results of liver function studies: Secondary | ICD-10-CM

## 2012-12-25 ENCOUNTER — Encounter: Payer: Self-pay | Admitting: Gastroenterology

## 2012-12-25 ENCOUNTER — Ambulatory Visit (INDEPENDENT_AMBULATORY_CARE_PROVIDER_SITE_OTHER): Payer: BC Managed Care – PPO | Admitting: Gastroenterology

## 2012-12-25 ENCOUNTER — Other Ambulatory Visit: Payer: BC Managed Care – PPO

## 2012-12-25 VITALS — BP 124/82 | HR 72 | Ht 65.0 in | Wt 135.1 lb

## 2012-12-25 DIAGNOSIS — R1031 Right lower quadrant pain: Secondary | ICD-10-CM

## 2012-12-25 DIAGNOSIS — R7401 Elevation of levels of liver transaminase levels: Secondary | ICD-10-CM

## 2012-12-25 DIAGNOSIS — R197 Diarrhea, unspecified: Secondary | ICD-10-CM

## 2012-12-25 MED ORDER — HYOSCYAMINE SULFATE 0.125 MG SL SUBL: SUBLINGUAL_TABLET | SUBLINGUAL | Status: DC

## 2012-12-25 NOTE — Progress Notes (Signed)
History of Present Illness: This is a 22 year old white male accompanied by his girlfriend. He has mildly elevated transaminases, right lower quadrant pain, abdominal bloating and diarrhea. He states all the symptoms started after beginning BuSpar, Zoloft and metoprolol. Mildly elevated transaminases were noted on 2 occasions. He states he notes loose bowel movements that generally occur 1 to 2 hours following meals associated with right lower quadrant pain and bloating. Abdominal ultrasound result is below. Denies weight loss, constipation, change in stool caliber, melena, hematochezia, nausea, vomiting, dysphagia, reflux symptoms, chest pain.  ABD US IMPRESSION:  1. Unchanged small gallbladder wall polyp.  2. 8 mm hyperechoic area within the liver, potentially small  hemangioma, unchanged from prior. Technically too small to accurately characterize at this time   No Known Allergies Outpatient Prescriptions Prior to Visit  Medication Sig Dispense Refill  . busPIRone (BUSPAR) 15 MG tablet TAKE 1/2 tablet per day      . fluticasone (FLONASE) 50 MCG/ACT nasal spray       . metoprolol succinate (TOPROL-XL) 25 MG 24 hr tablet Take 1 tablet (25 mg total) by mouth daily.  30 tablet  6  . sertraline (ZOLOFT) 50 MG tablet TAKE 1 TABLET BY MOUTH EVERY DAY  30 tablet  6   No facility-administered medications prior to visit.   Past Medical History  Diagnosis Date  . Asthma   . Hypertension   . Palpitations   . Cardiac arrhythmia due to congenital heart disease   . Gallbladder polyp    No past surgical history on file. History   Social History  . Marital Status: Single    Spouse Name: N/A    Number of Children: N/A  . Years of Education: N/A   Social History Main Topics  . Smoking status: Never Smoker   . Smokeless tobacco: Not on file  . Alcohol Use: Yes     Comment: occassional  . Drug Use: No  . Sexual Activity: Yes   Other Topics Concern  . Not on file   Social History  Narrative  . No narrative on file   Family History  Problem Relation Age of Onset  . Hypertension Father   . Cancer Maternal Grandfather   . Hypertension Paternal Grandmother   . Stroke Paternal Grandmother   . Hypertension Paternal Grandfather      Review of Systems: Pertinent positive and negative review of systems were noted in the above HPI section. All other review of systems were otherwise negative.   Physical Exam: General: Well developed , well nourished, no acute distress Head: Normocephalic and atraumatic Eyes:  sclerae anicteric, EOMI Ears: Normal auditory acuity Mouth: No deformity or lesions Neck: Supple, no masses or thyromegaly Lungs: Clear throughout to auscultation Heart: Regular rate and rhythm; no murmurs, rubs or bruits Abdomen: Soft, non tender and non distended. No masses, hepatosplenomegaly or hernias noted. Normal Bowel sounds Musculoskeletal: Symmetrical with no gross deformities  Skin: No lesions on visible extremities Pulses:  Normal pulses noted Extremities: No clubbing, cyanosis, edema or deformities noted Neurological: Alert oriented x 4, grossly nonfocal Cervical Nodes:  No significant cervical adenopathy Inguinal Nodes: No significant inguinal adenopathy Psychological:  Alert and cooperative. Normal mood and affect  Assessment and Recommendations:  1. Minimally elevated transaminases. Rule out medication side effects. Rule out underlying liver disease. Send all standard hepatic serologies. Return office visit in one month.  2. Abdominal pain, abdominal bloating and diarrhea could be medication related side effects. Possible IBS. Stool studies  and celiac panel. Trial of Levsin. Trial of lactose avoidance. Return office visit in one month.  3. 8mm hepatic lesion noted on CT and ultrasound. Too small to adequately characterize. Possible hemangioma

## 2012-12-25 NOTE — Patient Instructions (Signed)
Your physician has requested that you go to the basement for lab work before leaving today.  We have sent the following medications to your pharmacy for you to pick up at your convenience: Levsin.  Follow instructions on Hemoccult cards and mail them back to [place] when finished.   Thank you for choosing me and [facility].  Venita Lick. Pleas Koch., MD., Clementeen Graham

## 2012-12-26 LAB — HEPATITIS C ANTIBODY: HCV Ab: NEGATIVE

## 2012-12-26 LAB — HEPATITIS B SURFACE ANTIGEN: Hepatitis B Surface Ag: NEGATIVE

## 2012-12-27 ENCOUNTER — Other Ambulatory Visit: Payer: Self-pay

## 2012-12-27 DIAGNOSIS — R197 Diarrhea, unspecified: Secondary | ICD-10-CM

## 2012-12-27 LAB — ANTI-SMOOTH MUSCLE ANTIBODY, IGG: Smooth Muscle Ab: 9 U (ref <20)

## 2012-12-28 LAB — CELIAC PANEL 10
Gliadin IgA: 6.2 U/mL (ref <20)
Gliadin IgG: 36.2 U/mL — ABNORMAL HIGH (ref <20)
Tissue Transglut Ab: 8.5 U/mL (ref <20)
Tissue Transglutaminase Ab, IgA: 4 U/mL (ref <20)

## 2012-12-28 LAB — ALPHA-1-ANTITRYPSIN: A-1 Antitrypsin, Ser: 130 mg/dL (ref 90–200)

## 2012-12-28 LAB — CERULOPLASMIN: Ceruloplasmin: 22 mg/dL (ref 20–60)

## 2013-01-15 ENCOUNTER — Other Ambulatory Visit: Payer: BC Managed Care – PPO

## 2013-01-15 DIAGNOSIS — R197 Diarrhea, unspecified: Secondary | ICD-10-CM

## 2013-01-15 DIAGNOSIS — R1031 Right lower quadrant pain: Secondary | ICD-10-CM

## 2013-01-16 ENCOUNTER — Other Ambulatory Visit (INDEPENDENT_AMBULATORY_CARE_PROVIDER_SITE_OTHER): Payer: BC Managed Care – PPO

## 2013-01-16 DIAGNOSIS — R197 Diarrhea, unspecified: Secondary | ICD-10-CM

## 2013-01-16 DIAGNOSIS — R1031 Right lower quadrant pain: Secondary | ICD-10-CM

## 2013-01-16 LAB — HEMOCCULT SLIDES (X 3 CARDS)
Fecal Occult Blood: NEGATIVE
OCCULT 1: NEGATIVE
OCCULT 2: NEGATIVE
OCCULT 3: NEGATIVE
OCCULT 4: NEGATIVE
OCCULT 5: NEGATIVE

## 2013-01-23 LAB — CELIAC DISEASE HLA DQ ASSOC.
DQ2 (DQA1 0501/0505,DQB1 02XX): NEGATIVE
DQ8 (DQA1 03XX, DQB1 0302): NEGATIVE

## 2013-02-10 ENCOUNTER — Encounter: Payer: Self-pay | Admitting: Gastroenterology

## 2013-06-23 ENCOUNTER — Other Ambulatory Visit: Payer: Self-pay | Admitting: Family Medicine

## 2013-06-23 NOTE — Telephone Encounter (Signed)
Med filled.  

## 2013-07-02 ENCOUNTER — Other Ambulatory Visit: Payer: Self-pay | Admitting: Family Medicine

## 2013-07-02 NOTE — Telephone Encounter (Signed)
Med filled.  

## 2013-12-12 ENCOUNTER — Other Ambulatory Visit: Payer: Self-pay | Admitting: General Practice

## 2013-12-12 NOTE — Telephone Encounter (Signed)
Med denied due to pt not being seen in over a year.

## 2013-12-12 NOTE — Telephone Encounter (Signed)
Med filled. Pt has not been filled in over a year. Letter mailed to notify pt.

## 2013-12-15 ENCOUNTER — Other Ambulatory Visit: Payer: Self-pay | Admitting: General Practice

## 2013-12-15 MED ORDER — BUSPIRONE HCL 15 MG PO TABS: 15.0000 mg | ORAL_TABLET | Freq: Two times a day (BID) | ORAL | Status: DC

## 2013-12-15 NOTE — Telephone Encounter (Signed)
Last OV 11-14-12 Med filled 07/02/13 #60 with 0  Letter mailed on 12-12-13 to schedule an appt.

## 2013-12-15 NOTE — Telephone Encounter (Signed)
Med filled and faxed.  

## 2014-01-09 HISTORY — PX: WISDOM TOOTH EXTRACTION: SHX21

## 2014-02-10 ENCOUNTER — Telehealth: Payer: Self-pay | Admitting: General Practice

## 2014-02-10 MED ORDER — BUSPIRONE HCL 15 MG PO TABS: 15.0000 mg | ORAL_TABLET | Freq: Two times a day (BID) | ORAL | Status: DC

## 2014-02-10 NOTE — Telephone Encounter (Signed)
Last OV 11-14-12 busiprone last filled 12-15-13 #60 with 0  Letter mailed to make appt, no upcoming

## 2014-02-10 NOTE — Telephone Encounter (Signed)
Ok for #30, no refills w/o appt 

## 2014-02-10 NOTE — Telephone Encounter (Signed)
15 day supply sent into pharmacy. No further refills without an appt.

## 2014-04-13 ENCOUNTER — Ambulatory Visit (INDEPENDENT_AMBULATORY_CARE_PROVIDER_SITE_OTHER): Payer: 59 | Admitting: Family Medicine

## 2014-04-13 ENCOUNTER — Encounter: Payer: Self-pay | Admitting: Family Medicine

## 2014-04-13 VITALS — BP 120/78 | HR 86 | Temp 99.2°F | Wt 127.4 lb

## 2014-04-13 DIAGNOSIS — J302 Other seasonal allergic rhinitis: Secondary | ICD-10-CM | POA: Diagnosis not present

## 2014-04-13 MED ORDER — AMOXICILLIN 875 MG PO TABS
875.0000 mg | ORAL_TABLET | Freq: Two times a day (BID) | ORAL | Status: DC
Start: 2014-04-13 — End: 2014-05-22

## 2014-04-13 MED ORDER — FLUTICASONE PROPIONATE 50 MCG/ACT NA SUSP: 2.0000 | Freq: Every day | NASAL | Status: DC

## 2014-04-13 MED ORDER — LORATADINE 10 MG PO TABS: 10.0000 mg | ORAL_TABLET | Freq: Every day | ORAL | Status: DC

## 2014-04-13 NOTE — Progress Notes (Signed)
    Subjective:     Miguel Love is a 24 y.o. male who presents for evaluation of symptoms of a URI. Symptoms include congestion, low grade fever, nasal congestion, post nasal drip, productive cough with  green colored sputum, purulent nasal discharge and sore throat. Onset of symptoms was several weeks ago, and has been gradually worsening since that time. Treatment to date: antihistamines and nasal steroids.  The following portions of the patient's history were reviewed and updated as appropriate: allergies, current medications, past family history, past medical history, past social history, past surgical history and problem list.  Review of Systems Pertinent items are noted in HPI.   Objective:    BP 120/78 mmHg  Pulse 86  Temp(Src) 99.2 F (37.3 C) (Oral)  Wt 127 lb 6.4 oz (57.788 kg)  SpO2 98% General appearance: alert, cooperative, appears stated age and no distress Ears: normal TM's and external ear canals both ears Nose: clear discharge, mild congestion, turbinates red, swollen, no sinus tenderness Throat: abnormal findings: mild oropharyngeal erythema and pnd Neck: mild anterior cervical adenopathy, supple, symmetrical, trachea midline and thyroid not enlarged, symmetric, no tenderness/mass/nodules Lungs: clear to auscultation bilaterally Heart: S1, S2 normal   Assessment:    allergic rhinitis   Plan:    Discussed the importance of avoiding unnecessary antibiotic therapy. Suggested symptomatic OTC remedies. Nasal steroids per orders. Follow up as needed. fill abx only if symptoms worsen   F/u prn

## 2014-04-13 NOTE — Progress Notes (Signed)
Pre visit review using our clinic review tool, if applicable. No additional management support is needed unless otherwise documented below in the visit note. 

## 2014-04-13 NOTE — Patient Instructions (Signed)

## 2014-04-23 ENCOUNTER — Other Ambulatory Visit: Payer: Self-pay | Admitting: General Practice

## 2014-04-23 MED ORDER — METOPROLOL SUCCINATE ER 25 MG PO TB24: 25.0000 mg | ORAL_TABLET | Freq: Every day | ORAL | Status: DC

## 2014-04-23 MED ORDER — SERTRALINE HCL 50 MG PO TABS: 50.0000 mg | ORAL_TABLET | Freq: Every day | ORAL | Status: DC

## 2014-04-23 NOTE — Telephone Encounter (Signed)
[facility] for #30 of each.  Pt needs to schedule appt/CPE as we are approaching 1 yr since I last saw him

## 2014-04-23 NOTE — Telephone Encounter (Signed)
Last OV 11-14-12 Metoprolol last filled  07-02-13 #30 with 6  No upcoming appts

## 2014-04-23 NOTE — Telephone Encounter (Signed)
zoloft last filled 07-02-13 #30 with 6

## 2014-04-23 NOTE — Telephone Encounter (Signed)
Med filled for #30.

## 2014-05-12 ENCOUNTER — Other Ambulatory Visit: Payer: Self-pay | Admitting: General Practice

## 2014-05-12 ENCOUNTER — Encounter: Payer: Self-pay | Admitting: General Practice

## 2014-05-12 NOTE — Telephone Encounter (Signed)
Last OV 11-14-12 (CPE),  Pt was seen for allergies 04-13-14 ([place]). No upcoming appts with [facility] Metoprolol was last filed 04-23-14 #30 with 0, note attached to make OV

## 2014-05-18 ENCOUNTER — Telehealth: Payer: Self-pay | Admitting: Family Medicine

## 2014-05-18 DIAGNOSIS — J302 Other seasonal allergic rhinitis: Secondary | ICD-10-CM

## 2014-05-18 MED ORDER — FLUTICASONE PROPIONATE 50 MCG/ACT NA SUSP: 2.0000 | Freq: Every day | NASAL | Status: DC

## 2014-05-18 MED ORDER — METOPROLOL SUCCINATE ER 25 MG PO TB24: 25.0000 mg | ORAL_TABLET | Freq: Every day | ORAL | Status: DC

## 2014-05-18 MED ORDER — BUSPIRONE HCL 15 MG PO TABS: 15.0000 mg | ORAL_TABLET | Freq: Two times a day (BID) | ORAL | Status: DC

## 2014-05-18 MED ORDER — SERTRALINE HCL 50 MG PO TABS: 50.0000 mg | ORAL_TABLET | Freq: Every day | ORAL | Status: DC

## 2014-05-18 MED ORDER — LORATADINE 10 MG PO TABS: 10.0000 mg | ORAL_TABLET | Freq: Every day | ORAL | Status: DC

## 2014-05-18 NOTE — Telephone Encounter (Signed)
Ok to refill x1 month and will fill x6 if she arrives for appt

## 2014-05-18 NOTE — Telephone Encounter (Signed)
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

## 2014-05-18 NOTE — Telephone Encounter (Signed)
All meds were filled for #30.

## 2014-05-20 ENCOUNTER — Telehealth: Payer: Self-pay

## 2014-05-20 ENCOUNTER — Ambulatory Visit: Payer: 59 | Admitting: Family Medicine

## 2014-05-20 NOTE — Telephone Encounter (Signed)
LMOVM

## 2014-05-21 ENCOUNTER — Encounter (HOSPITAL_COMMUNITY): Payer: Self-pay | Admitting: Emergency Medicine

## 2014-05-21 ENCOUNTER — Emergency Department (HOSPITAL_COMMUNITY)
Admission: EM | Admit: 2014-05-21 | Discharge: 2014-05-21 | Disposition: A | Discharge status: Home / Self Care | Payer: 59 | Source: Home / Self Care | Attending: Family Medicine | Admitting: Family Medicine

## 2014-05-21 DIAGNOSIS — K120 Recurrent oral aphthae: Secondary | ICD-10-CM

## 2014-05-21 DIAGNOSIS — K0510 Chronic gingivitis, plaque induced: Secondary | ICD-10-CM

## 2014-05-21 DIAGNOSIS — J029 Acute pharyngitis, unspecified: Secondary | ICD-10-CM

## 2014-05-21 LAB — POCT RAPID STREP A: Streptococcus, Group A Screen (Direct): NEGATIVE

## 2014-05-21 NOTE — Discharge Instructions (Signed)
Rapid strep negative Will advise you by phone if throat culture indicates need for additional treatment Warm salt water gargles Tylenol or ibuprofen as directed on packaging for pain PCP follow up if symptoms persist.  Dental Care and Dentist Visits Dental care supports good overall health. Regular dental visits can also help you avoid dental pain, bleeding, infection, and other more serious health problems in the future. It is important to keep the mouth healthy because diseases in the teeth, gums, and other oral tissues can spread to other areas of the body. Some problems, such as diabetes, heart disease, and pre-term labor have been associated with poor oral health.  See your dentist every 6 months. If you experience emergency problems such as a toothache or broken tooth, go to the dentist right away. If you see your dentist regularly, you may catch problems early. It is easier to be treated for problems in the early stages.  WHAT TO EXPECT AT A DENTIST VISIT  Your dentist will look for many common oral health problems and recommend proper treatment. At your regular dental visit, you can expect:  Gentle cleaning of the teeth and gums. This includes scraping and polishing. This helps to remove the sticky substance around the teeth and gums (plaque). Plaque forms in the mouth shortly after eating. Over time, plaque hardens on the teeth as tartar. If tartar is not removed regularly, it can cause problems. Cleaning also helps remove stains.  Periodic X-rays. These pictures of the teeth and supporting bone will help your dentist assess the health of your teeth.  Periodic fluoride treatments. Fluoride is a natural mineral shown to help strengthen teeth. Fluoride treatmentinvolves applying a fluoride gel or varnish to the teeth. It is most commonly done in children.  Examination of the mouth, tongue, jaws, teeth, and gums to look for any oral health problems, such as:  Cavities (dental caries). This  is decay on the tooth caused by plaque, sugar, and acid in the mouth. It is best to catch a cavity when it is small.  Inflammation of the gums caused by plaque buildup (gingivitis).  Problems with the mouth or malformed or misaligned teeth.  Oral cancer or other diseases of the soft tissues or jaws. KEEP YOUR TEETH AND GUMS HEALTHY For healthy teeth and gums, follow these general guidelines as well as your dentist's specific advice:  Have your teeth professionally cleaned at the dentist every 6 months.  Brush twice daily with a fluoride toothpaste.  Floss your teeth daily.  Ask your dentist if you need fluoride supplements, treatments, or fluoride toothpaste.  Eat a healthy diet. Reduce foods and drinks with added sugar.  Avoid smoking. TREATMENT FOR ORAL HEALTH PROBLEMS If you have oral health problems, treatment varies depending on the conditions present in your teeth and gums.  Your caregiver will most likely recommend good oral hygiene at each visit.  For cavities, gingivitis, or other oral health disease, your caregiver will perform a procedure to treat the problem. This is typically done at a separate appointment. Sometimes your caregiver will refer you to another dental specialist for specific tooth problems or for surgery. SEEK IMMEDIATE DENTAL CARE IF:  You have pain, bleeding, or soreness in the gum, tooth, jaw, or mouth area.  A permanent tooth becomes loose or separated from the gum socket.  You experience a blow or injury to the mouth or jaw area. Document Released: 09/07/2010 Document Revised: 03/20/2011 Document Reviewed: 09/07/2010 [facility] Patient Information 2015 [place], [place]. This information  is not intended to replace advice given to you by your health care provider. Make sure you discuss any questions you have with your health care provider.  Canker Sores  Canker sores are painful, open sores on the inside of the mouth and cheek. They may be white or  yellow. The sores usually heal in 1 to 2 weeks. Women are more likely than men to have recurrent canker sores. CAUSES The cause of canker sores is not well understood. More than one cause is likely. Canker sores do not appear to be caused by certain types of germs (viruses or bacteria). Canker sores may be caused by:  An allergic reaction to certain foods.  Digestive problems.  Not having enough vitamin U27, folic acid, and iron.  Male sex hormones. Sores may come only during certain phases of a menstrual cycle. Often, there is improvement during pregnancy.  Genetics. Some people seem to inherit canker sore problems. Emotional stress and injuries to the mouth may trigger outbreaks, but not cause them.  DIAGNOSIS Canker sores are diagnosed by exam.  TREATMENT  Patients who have frequent bouts of canker sores may have cultures taken of the sores, blood tests, or allergy tests. This helps determine if their sores are caused by a poor diet, an allergy, or some other preventable or treatable disease.  Vitamins may prevent recurrences or reduce the severity of canker sores in people with poor nutrition.  Numbing ointments can relieve pain. These are available in drug stores without a prescription.  Anti-inflammatory steroid mouth rinses or gels may be prescribed by your caregiver for severe sores.  Oral steroids may be prescribed if you have severe, recurrent canker sores. These strong medicines can cause many side effects and should be used only under the close direction of a dentist or physician.  Mouth rinses containing the antibiotic medicine may be prescribed. They may lessen symptoms and speed healing. Healing usually happens in about 1 or 2 weeks with or without treatment. Certain antibiotic mouth rinses given to pregnant women and young children can permanently stain teeth. Talk to your caregiver about your treatment. HOME CARE INSTRUCTIONS   Avoid foods that cause canker sores  for you.  Avoid citrus juices, spicy or salty foods, and coffee until the sores are healed.  Use a soft-bristled toothbrush.  Chew your food carefully to avoid biting your cheek.  Apply topical numbing medicine to the sore to help relieve pain.  Apply a thin paste of baking soda and water to the sore to help heal the sore.  Only use mouth rinses or medicines for pain or discomfort as directed by your caregiver. SEEK MEDICAL CARE IF:   Your symptoms are not better in 1 week.  Your sores are still present after 2 weeks.  Your sores are very painful.  You have trouble breathing or swallowing.  Your sores come back frequently. Document Released: 04/22/2010 Document Revised: 04/22/2012 Document Reviewed: 04/22/2010 [facility] Patient Information 2015 [place], [place]. This information is not intended to replace advice given to you by your health care provider. Make sure you discuss any questions you have with your health care provider.  Gingivitis Gingivitis is a form of gum (periodontal) disease that causes redness, soreness, and swelling (inflammation) of your gums. CAUSES The most common cause of gingivitis is poor oral hygiene. A sticky substance made of bacteria, mucus, and food particles (plaque), is deposited on the exposed part of teeth. As plaque builds up, it reacts with the saliva in your mouth  to form something called  tartar. Tartar is a hard deposit that becomes trapped around the base of the tooth. Plaque and tartar irritate the gums, leading to the formation of gingivitis. Other factors that increase your risk for gingivitis include:   Tobacco use.  Diabetes.  Older age.  Certain medications.  Certain viral or fungal infections.  Dry mouth.  Hormonal changes such as during pregnancy.  Poor nutrition.  Substance abuse.  Poor fitting dental restorations or appliances. SYMPTOMS You may notice inflammation of the soft tissue (gingiva) around the teeth. When  these tissues become inflamed, they bleed easily, especially during flossing or brushing. The gums may also be:   Tender to the touch.  Bright red, purple red, or have a shiny appearance.  Swollen.  Wearing away from the teeth (receding), which exposes more of the tooth. Bad breath is often present. Continued infection around teeth can eventually cause cavities and loosen teeth. This may lead to eventual tooth loss. DIAGNOSIS A medical and dental history will be taken. Your mouth, teeth, and gums will be examined. Your dentist will look for soft, swollen purple-red, irritated gums. There may be deposits of plaque and tartar at the base of the teeth. Your gums will be looked at for the degree of redness, puffiness, and bleeding tendencies. Your dentist will see if any of the teeth are loose. X-rays may be taken to see if the inflammation has spread to the supporting structures of the teeth. TREATMENT The goal is to reduce and reverse the inflammation. Proper treatment can usually reverse the symptoms of gingivitis and prevent further progression of the disease. Have your teeth cleaned. During the cleaning, all plaque and tartar will be removed. Instruction for proper home care will be given. You will need regular professional cleanings and check-ups in the future. HOME CARE INSTRUCTIONS  Brush your teeth twice a day and floss at least once per day. When flossing, it is best to floss first then brush.  Limit sugar between meals and maintain a well-balanced diet.  Even the best dental hygiene will not prevent plaque from developing. It is necessary for you to see your dentist on a regular basis for cleaning and regular checkups.  Your dentist can recommend proper oral hygiene and mouth care and suggest special toothpastes or mouth rinses.  Stop smoking. SEEK DENTAL OR MEDICAL CARE IF:  You have painful, reddened tissue around your teeth, or you have puffy swollen gums.  You have difficulty  chewing.  You notice any loose or infected teeth.  You have swollen glands.  Your gums bleed easily when you brush your teeth or are very tender to the touch. Document Released: 06/21/2000 Document Revised: 03/20/2011 Document Reviewed: 04/01/2010 [facility] Patient Information 2015 [place], [place]. This information is not intended to replace advice given to you by your health care provider. Make sure you discuss any questions you have with your health care provider.  Pharyngitis Pharyngitis is redness, pain, and swelling (inflammation) of your pharynx.  CAUSES  Pharyngitis is usually caused by infection. Most of the time, these infections are from viruses (viral) and are part of a cold. However, sometimes pharyngitis is caused by bacteria (bacterial). Pharyngitis can also be caused by allergies. Viral pharyngitis may be spread from person to person by coughing, sneezing, and personal items or utensils (cups, forks, spoons, toothbrushes). Bacterial pharyngitis may be spread from person to person by more intimate contact, such as kissing.  SIGNS AND SYMPTOMS  Symptoms of pharyngitis include:  Sore throat.   Tiredness (fatigue).   Low-grade fever.   Headache.  Joint pain and muscle aches.  Skin rashes.  Swollen lymph nodes.  Plaque-like film on throat or tonsils (often seen with bacterial pharyngitis). DIAGNOSIS  Your health care provider will ask you questions about your illness and your symptoms. Your medical history, along with a physical exam, is often all that is needed to diagnose pharyngitis. Sometimes, a rapid strep test is done. Other lab tests may also be done, depending on the suspected cause.  TREATMENT  Viral pharyngitis will usually get better in 3-4 days without the use of medicine. Bacterial pharyngitis is treated with medicines that kill germs (antibiotics).  HOME CARE INSTRUCTIONS   Drink enough water and fluids to keep your urine clear or pale yellow.    Only take over-the-counter or prescription medicines as directed by your health care provider:   If you are prescribed antibiotics, make sure you finish them even if you start to feel better.   Do not take aspirin.   Get lots of rest.   Gargle with 8 oz of salt water ( tsp of salt per 1 qt of water) as often as every 1-2 hours to soothe your throat.   Throat lozenges (if you are not at risk for choking) or sprays may be used to soothe your throat. SEEK MEDICAL CARE IF:   You have large, tender lumps in your neck.  You have a rash.  You cough up green, yellow-brown, or bloody spit. SEEK IMMEDIATE MEDICAL CARE IF:   Your neck becomes stiff.  You drool or are unable to swallow liquids.  You vomit or are unable to keep medicines or liquids down.  You have severe pain that does not go away with the use of recommended medicines.  You have trouble breathing (not caused by a stuffy nose). MAKE SURE YOU:   Understand these instructions.  Will watch your condition.  Will get help right away if you are not doing well or get worse. Document Released: 12/26/2004 Document Revised: 10/16/2012 Document Reviewed: 09/02/2012 [facility] Patient Information 2015 [place], [place]. This information is not intended to replace advice given to you by your health care provider. Make sure you discuss any questions you have with your health care provider.  Strep Throat Tests While most sore throats are caused by viruses, at times they are caused by a bacteria called group A Streptococci (strep throat). It is important to determine the cause because the strep bacteria is treated with antibiotic medication. There are 2 types of tests for strep throat: a rapid strep test and a throat culture. Both tests are done by wiping a swab over the back of the throat and then using chemicals to identify the type of bacteria present. The rapid strep test takes 10 to 20 minutes. If the rapid strep test is  negative, a throat culture may be performed to confirm the results. With a throat culture, the swab is used to spread the bacteria on a gel plate and grow it in a lab, which may take 1 to 2 days. In some cases, the culture will detect strep bacteria not found with the rapid strep test. If the result of the rapid strep test is positive, no further testing is needed, and your caregiver will prescribe antibiotics. Not all test results are available during your visit. If your test results are not back during the visit, make an appointment with your caregiver to find out the results. Do not  assume everything is normal if you have not heard from your caregiver or the medical facility. It is important for you to follow up on all of your test results. SEEK MEDICAL CARE IF:   Your symptoms are not improving within 1 to 2 days, or you are getting worse.  You have any other questions or concerns. SEEK IMMEDIATE MEDICAL CARE IF:   You have increased difficulty with swallowing.  You develop trouble breathing.  You have a fever. Document Released: 02/03/2004 Document Revised: 03/20/2011 Document Reviewed: 04/02/2013 [facility] Patient Information 2015 [place], [place]. This information is not intended to replace advice given to you by your health care provider. Make sure you discuss any questions you have with your health care provider.  Sore Throat A sore throat is pain, burning, irritation, or scratchiness of the throat. There is often pain or tenderness when swallowing or talking. A sore throat may be accompanied by other symptoms, such as coughing, sneezing, fever, and swollen neck glands. A sore throat is often the first sign of another sickness, such as a cold, flu, strep throat, or mononucleosis (commonly known as mono). Most sore throats go away without medical treatment. CAUSES  The most common causes of a sore throat include:  A viral infection, such as a cold, flu, or mono.  A bacterial infection,  such as strep throat, tonsillitis, or whooping cough.  Seasonal allergies.  Dryness in the air.  Irritants, such as smoke or pollution.  Gastroesophageal reflux disease (GERD). HOME CARE INSTRUCTIONS   Only take over-the-counter medicines as directed by your caregiver.  Drink enough fluids to keep your urine clear or pale yellow.  Rest as needed.  Try using throat sprays, lozenges, or sucking on hard candy to ease any pain (if older than 4 years or as directed).  Sip warm liquids, such as broth, herbal tea, or warm water with honey to relieve pain temporarily. You may also eat or drink cold or frozen liquids such as frozen ice pops.  Gargle with salt water (mix 1 tsp salt with 8 oz of water).  Do not smoke and avoid secondhand smoke.  Put a cool-mist humidifier in your bedroom at night to moisten the air. You can also turn on a hot shower and sit in the bathroom with the door closed for 5-10 minutes. SEEK IMMEDIATE MEDICAL CARE IF:  You have difficulty breathing.  You are unable to swallow fluids, soft foods, or your saliva.  You have increased swelling in the throat.  Your sore throat does not get better in 7 days.  You have nausea and vomiting.  You have a fever or persistent symptoms for more than 2-3 days.  You have a fever and your symptoms suddenly get worse. MAKE SURE YOU:   Understand these instructions.  Will watch your condition.  Will get help right away if you are not doing well or get worse. Document Released: 02/03/2004 Document Revised: 12/13/2011 Document Reviewed: 09/03/2011 [facility] Patient Information 2015 [place], [place]. This information is not intended to replace advice given to you by your health care provider. Make sure you discuss any questions you have with your health care provider.  Salt Water Gargle This solution will help make your mouth and throat feel better. HOME CARE INSTRUCTIONS   Mix 1 teaspoon of salt in 8 ounces of warm  water.  Gargle with this solution as much or often as you need or as directed. Swish and gargle gently if you have any sores or wounds in  your mouth.  Do not swallow this mixture. Document Released: 09/30/2003 Document Revised: 03/20/2011 Document Reviewed: 02/21/2008 [facility] Patient Information 2015 [place], [place]. This information is not intended to replace advice given to you by your health care provider. Make sure you discuss any questions you have with your health care provider.

## 2014-05-21 NOTE — Telephone Encounter (Signed)
Pt returning call from yesterday for pre-appointment questions. Same ph#.

## 2014-05-21 NOTE — ED Notes (Addendum)
Sore throat onset Tuesday night 5/10.  Since then, the pain has worsened.  White coating to tongue, c/o fever blister to right upper lip

## 2014-05-21 NOTE — ED Provider Notes (Signed)
CSN: [id number]     Arrival date & time 05/21/14  1800 History   First MD Initiated Contact with Patient 05/21/14 1839     Chief Complaint  Patient presents with  . Sore Throat   (Consider location/radiation/quality/duration/timing/severity/associated sxs/prior Treatment) HPI Comments: States he has a similar episode about 3 weeks ago that was attributed to both nonspecific pharyngitis and allergic rhinitis. States he was treated with amoxicillin and anti-allergy medications and symptoms improved. No fever. Also few days of small aphthous ulcer at right lateral lower lip  Patient is a 24 y.o. male presenting with pharyngitis. The history is provided by the patient.  Sore Throat This is a new problem. Episode onset: 2 days. The problem occurs constantly. The problem has not changed since onset.   Past Medical History  Diagnosis Date  . Asthma   . Hypertension   . Palpitations   . Cardiac arrhythmia due to congenital heart disease     pt denies 12/25/12  . Gallbladder polyp   . Anxiety    History reviewed. No pertinent past surgical history. Family History  Problem Relation Age of Onset  . Hypertension Father   . Lung cancer Maternal Grandfather   . Hypertension Paternal Grandmother   . Stroke Paternal Grandmother   . Hypertension Paternal Grandfather   . Colon polyps Father   . Colon cancer Paternal Grandfather     great grand   History  Substance Use Topics  . Smoking status: Never Smoker   . Smokeless tobacco: Never Used  . Alcohol Use: Yes     Comment: occassional    Review of Systems  All other systems reviewed and are negative.   Allergies  Review of patient's allergies indicates no known allergies.  Home Medications   Prior to Admission medications   Medication Sig Start Date End Date Taking? Authorizing Provider  amoxicillin (AMOXIL) 875 MG tablet Take 1 tablet (875 mg total) by mouth 2 (two) times daily. Patient not taking: Reported on 05/21/2014 04/13/14    Rosalita Chessman, DO  busPIRone (BUSPAR) 15 MG tablet Take 1 tablet (15 mg total) by mouth 2 (two) times daily. 05/18/14   Midge Minium, MD  fluticasone (FLONASE) 50 MCG/ACT nasal spray Place 2 sprays into both nostrils daily. 05/18/14   Midge Minium, MD  loratadine (CLARITIN) 10 MG tablet Take 1 tablet (10 mg total) by mouth daily. 05/18/14   Midge Minium, MD  metoprolol succinate (TOPROL-XL) 25 MG 24 hr tablet Take 1 tablet (25 mg total) by mouth daily. 05/18/14   Midge Minium, MD  sertraline (ZOLOFT) 50 MG tablet Take 1 tablet (50 mg total) by mouth daily. Patient not taking: Reported on 05/21/2014 05/18/14   Midge Minium, MD   BP 127/67 mmHg  Pulse 80  Temp(Src) 98.5 F (36.9 C) (Oral)  Resp 16  SpO2 99% Physical Exam  Constitutional: He is oriented to person, place, and time. He appears well-developed and well-nourished. No distress.  HENT:  Head: Normocephalic and atraumatic.  Right Ear: Hearing, external ear and ear canal normal. A middle ear effusion is present.  Left Ear: Hearing, tympanic membrane, external ear and ear canal normal.  Nose: Nose normal.  Mouth/Throat: Uvula is midline. No oral lesions. No trismus in the jaw. No uvula swelling. Posterior oropharyngeal erythema present. No oropharyngeal exudate, posterior oropharyngeal edema or tonsillar abscesses.  +clear fluid behind right TM +mild diffuse gingivitis with scattered area of dental decay Small shallow 3-4 mm aphthous  ulcer at right lower lip  Eyes: Conjunctivae are normal. No scleral icterus.  Neck: Normal range of motion. Neck supple.  Bilateral shotty tender cervical lymphadenopathy  Cardiovascular: Normal rate, regular rhythm and normal heart sounds.   Pulmonary/Chest: Effort normal and breath sounds normal. No respiratory distress. He has no wheezes.  Musculoskeletal: Normal range of motion.  Neurological: He is alert and oriented to person, place, and time.  Skin: Skin is warm and dry.   Psychiatric: He has a normal mood and affect. His behavior is normal.  Nursing note and vitals reviewed.   ED Course  Procedures (including critical care time) Labs Review Labs Reviewed  POCT RAPID STREP A ([facility] URG CARE ONLY)    Imaging Review No results found.   MDM   1. Pharyngitis   2. Gingivitis   3. Aphthous ulcer    Rapid strep negative Will advise patient if throat culture indicates need for additional treatment Warm salt water gargles Tylenol or ibuprofen as directed on packaging for pain PCP follow up if symptoms persist.    Lutricia Feil, PA 05/21/14 1920

## 2014-05-22 ENCOUNTER — Encounter: Payer: Self-pay | Admitting: Family Medicine

## 2014-05-22 ENCOUNTER — Ambulatory Visit (INDEPENDENT_AMBULATORY_CARE_PROVIDER_SITE_OTHER): Payer: 59 | Admitting: Family Medicine

## 2014-05-22 VITALS — BP 124/80 | HR 113 | Temp 101.2°F | Resp 16 | Ht 65.0 in | Wt 119.1 lb

## 2014-05-22 DIAGNOSIS — J302 Other seasonal allergic rhinitis: Secondary | ICD-10-CM | POA: Diagnosis not present

## 2014-05-22 DIAGNOSIS — J029 Acute pharyngitis, unspecified: Secondary | ICD-10-CM | POA: Diagnosis not present

## 2014-05-22 DIAGNOSIS — F419 Anxiety disorder, unspecified: Secondary | ICD-10-CM

## 2014-05-22 DIAGNOSIS — R6889 Other general symptoms and signs: Secondary | ICD-10-CM | POA: Diagnosis not present

## 2014-05-22 LAB — POCT RAPID STREP A (OFFICE): Rapid Strep A Screen: POSITIVE — AB

## 2014-05-22 LAB — POCT INFLUENZA A/B
INFLUENZA B, POC: NEGATIVE
Influenza A, POC: NEGATIVE

## 2014-05-22 MED ORDER — AMOXICILLIN 875 MG PO TABS: 875.0000 mg | ORAL_TABLET | Freq: Two times a day (BID) | ORAL | Status: DC

## 2014-05-22 MED ORDER — METOPROLOL SUCCINATE ER 25 MG PO TB24: 25.0000 mg | ORAL_TABLET | Freq: Every day | ORAL | Status: DC

## 2014-05-22 MED ORDER — SERTRALINE HCL 50 MG PO TABS: 50.0000 mg | ORAL_TABLET | Freq: Every day | ORAL | Status: DC

## 2014-05-22 NOTE — Addendum Note (Signed)
Addended by: Kris Hartmann on: 05/22/2014 09:45 AM   Modules accepted: Orders

## 2014-05-22 NOTE — Assessment & Plan Note (Signed)
Improving.  Pt would like to wean one of his meds.  Since he is only taking Buspar once daily, will stop this.  Continue Zoloft.  Refill provided.  Will continue to follow.

## 2014-05-22 NOTE — Assessment & Plan Note (Signed)
New.  Pt w/ fever, body aches, sore throat, HA.  Flu test negative.  Strep +.  Start Amox.  Reviewed supportive care and red flags that should prompt return.  Pt expressed understanding and is in agreement w/ plan.

## 2014-05-22 NOTE — Progress Notes (Signed)
Pre visit review using our clinic review tool, if applicable. No additional management support is needed unless otherwise documented below in the visit note. 

## 2014-05-22 NOTE — Patient Instructions (Signed)
Schedule your complete physical in 6 months You have strep- start the Amoxicillin twice daily Drink plenty of fluids REST! Alternate tylenol and ibuprofen as needed for pain/fever STOP the buspar Continue the Zoloft daily Call with any questions or concerns Hang in there! Happy Early [place]!

## 2014-05-22 NOTE — Progress Notes (Signed)
   Subjective:    Patient ID: Miguel Love, male    DOB: July 13, 1990, 24 y.o.   MRN: [id number]  HPI URI- pt is due for CPE today but is not feeling well and has fever.  sxs started 3 days ago w/ sore throat.  Was seen 4/4 and tx'd w/ Amox.  Went to UC last night and dx'd w/ viral illness.  Strep (-), cx pending.  Now w/ swollen LNs, painful to swallow, + mouth ulcer.  Anxiety- chronic problem.  Pt reports sxs are well controlled and would like to wean off 1 of the meds.  Only taking Buspar once daily.     Review of Systems For ROS see HPI     Objective:   Physical Exam  Constitutional: He is oriented to person, place, and time. He appears well-developed and well-nourished. No distress.  HENT:  Head: Normocephalic and atraumatic.  TMs w/ clear fluid present bilaterally Strong breath odor, tonsillar enlargement, exudate No TTP over sinuses  Eyes: Conjunctivae and EOM are normal. Pupils are equal, round, and reactive to light.  Neck: Normal range of motion.  Cardiovascular: Regular rhythm and intact distal pulses.   Tachy but regular S1/S2  Pulmonary/Chest: Effort normal and breath sounds normal. No respiratory distress. He has no wheezes. He has no rales.  Musculoskeletal: He exhibits no edema.  Lymphadenopathy:    He has cervical adenopathy.  Neurological: He is alert and oriented to person, place, and time. No cranial nerve deficit. Coordination normal.  Skin: Skin is warm and dry.  Psychiatric: He has a normal mood and affect. His behavior is normal. Thought content normal.  Vitals reviewed.         Assessment & Plan:

## 2014-05-24 LAB — CULTURE, GROUP A STREP: Strep A Culture: NEGATIVE

## 2014-05-25 ENCOUNTER — Telehealth: Payer: Self-pay | Admitting: Family Medicine

## 2014-05-25 MED ORDER — MAGIC MOUTHWASH W/LIDOCAINE: 5.0000 mL | Freq: Four times a day (QID) | ORAL | Status: DC | PRN

## 2014-05-25 NOTE — Telephone Encounter (Signed)
Ok for magic mouthwash swish and spit prn pain.

## 2014-05-25 NOTE — Telephone Encounter (Signed)
Med filled and pt notified.  

## 2014-05-25 NOTE — Telephone Encounter (Signed)
Caller name: Ashir Relation to pt: self Call back number: [phone] Pharmacy: [facility]  Reason for call:   Patient states that his mouth is still bothering him from last visit. Diagnosed with strep. He wants to know if magic mouthwash could be called in for him?

## 2014-06-03 ENCOUNTER — Encounter: Payer: Self-pay | Admitting: Physician Assistant

## 2014-06-03 ENCOUNTER — Ambulatory Visit (INDEPENDENT_AMBULATORY_CARE_PROVIDER_SITE_OTHER): Payer: 59 | Admitting: Physician Assistant

## 2014-06-03 VITALS — BP 119/68 | HR 61 | Temp 97.8°F | Ht 65.0 in | Wt 117.8 lb

## 2014-06-03 DIAGNOSIS — J020 Streptococcal pharyngitis: Secondary | ICD-10-CM | POA: Insufficient documentation

## 2014-06-03 NOTE — Progress Notes (Signed)
Patient presents to clinic today for follow-up of strep throat s/p ABX treatment with Amoxicillin.  Patient endorses resolution of fever and sore throat but still notes mild neck discomfort and swollen lymph node.  Denies new symptoms.  Past Medical History  Diagnosis Date  . Asthma   . Hypertension   . Palpitations   . Cardiac arrhythmia due to congenital heart disease     pt denies 12/25/12  . Gallbladder polyp   . Anxiety     Current Outpatient Prescriptions on File Prior to Visit  Medication Sig Dispense Refill  . Alum & Mag Hydroxide-Simeth (MAGIC MOUTHWASH W/LIDOCAINE) SOLN Take 5 mLs by mouth 4 (four) times daily as needed for mouth pain. 100 mL 0  . fluticasone (FLONASE) 50 MCG/ACT nasal spray Place 2 sprays into both nostrils daily. 16 g 0  . loratadine (CLARITIN) 10 MG tablet Take 1 tablet (10 mg total) by mouth daily. 30 tablet 0  . metoprolol succinate (TOPROL-XL) 25 MG 24 hr tablet Take 1 tablet (25 mg total) by mouth daily. 30 tablet 6  . sertraline (ZOLOFT) 50 MG tablet Take 1 tablet (50 mg total) by mouth daily. 30 tablet 6  . amoxicillin (AMOXIL) 875 MG tablet Take 1 tablet (875 mg total) by mouth 2 (two) times daily. (Patient not taking: Reported on 06/03/2014) 20 tablet 0   No current facility-administered medications on file prior to visit.    No Known Allergies  Family History  Problem Relation Age of Onset  . Hypertension Father   . Lung cancer Maternal Grandfather   . Hypertension Paternal Grandmother   . Stroke Paternal Grandmother   . Hypertension Paternal Grandfather   . Colon polyps Father   . Colon cancer Paternal Grandfather     great grand    History   Social History  . Marital Status: Single    Spouse Name: N/A  . Number of Children: 0  . Years of Education: N/A   Occupational History  .  [employer]   Social History Main Topics  . Smoking status: Never Smoker   . Smokeless tobacco: Never Used  . Alcohol Use: Yes     Comment:  occassional  . Drug Use: No  . Sexual Activity: Yes   Other Topics Concern  . None   Social History Narrative    Review of Systems - See HPI.  All other ROS are negative.  BP 119/68 mmHg  Pulse 61  Temp(Src) 97.8 F (36.6 C) (Oral)  Ht 5\' 5"  (1.651 m)  Wt 117 lb 12.8 oz (53.434 kg)  BMI 19.60 kg/m2  SpO2 100%  Physical Exam  Constitutional: He is oriented to person, place, and time and well-developed, well-nourished, and in no distress.  HENT:  Head: Normocephalic and atraumatic.  Right Ear: External ear normal.  Left Ear: External ear normal.  Nose: Nose normal.  Mouth/Throat: Uvula is midline, oropharynx is clear and moist and mucous membranes are normal. No oropharyngeal exudate, posterior oropharyngeal edema, posterior oropharyngeal erythema or tonsillar abscesses.  TM within normal limits.  Eyes: Conjunctivae are normal.  Neck: Neck supple.  Cardiovascular: Normal rate, regular rhythm, normal heart sounds and intact distal pulses.   Pulmonary/Chest: Effort normal and breath sounds normal. No respiratory distress. He has no wheezes. He has no rales. He exhibits no tenderness.  Lymphadenopathy:    He has cervical adenopathy.  Neurological: He is alert and oriented to person, place, and time.  Skin: Skin is warm and dry. No  rash noted.  Psychiatric: Affect normal.  Vitals reviewed.   Recent Results (from the past 2160 hour(s))  Culture, Group A Strep     Status: None   Collection Time: 05/21/14  6:56 PM  Result Value Ref Range   Strep A Culture Negative     Comment: (NOTE) Performed At: [facility], [place] [id number] Lindon Romp MD DJ:[id number]   POCT rapid strep A [facility])     Status: None   Collection Time: 05/21/14  7:00 PM  Result Value Ref Range   Streptococcus, Group A Screen (Direct) NEGATIVE NEGATIVE  POCT Influenza A/B     Status: Normal   Collection Time: 05/22/14  9:28 AM  Result Value Ref Range    Influenza A, POC Negative    Influenza B, POC Negative   POCT rapid strep A     Status: Abnormal   Collection Time: 05/22/14  9:45 AM  Result Value Ref Range   Rapid Strep A Screen Positive (A) Negative    Assessment/Plan: Strep pharyngitis Resolved.  Oropharyngeal examination unremarkable.  There is still slightly palpable and tender left anterior cervical lymph node.  Still reactive to recent infection but will improve over next few weeks.  See no reason for additional ABX. Supportive measures reviewed.  Follow-up PRN.

## 2014-06-03 NOTE — Patient Instructions (Signed)
Your throat looks goo on exam.  I see no tonsillar swelling, redness or exudate.  Do not think another round of antibiotics is indicated. Lymph node is reactive to recent infection but should resolve over the next week.  Increase fluids.  Do salt-water gargles twice daily.  Start a probiotic.  Use ES tylenol if needed for tenderness.  Call or return to clinic if symptoms are not resolving.

## 2014-06-03 NOTE — Progress Notes (Signed)
Pre visit review using our clinic review tool, if applicable. No additional management support is needed unless otherwise documented below in the visit note. 

## 2014-06-03 NOTE — Assessment & Plan Note (Signed)
Resolved.  Oropharyngeal examination unremarkable.  There is still slightly palpable and tender left anterior cervical lymph node.  Still reactive to recent infection but will improve over next few weeks.  See no reason for additional ABX. Supportive measures reviewed.  Follow-up PRN.

## 2014-07-05 ENCOUNTER — Other Ambulatory Visit: Payer: Self-pay | Admitting: Family Medicine

## 2014-07-06 NOTE — Telephone Encounter (Signed)
Med filled.  

## 2014-07-20 ENCOUNTER — Encounter: Payer: Self-pay | Admitting: Medical

## 2014-07-20 ENCOUNTER — Ambulatory Visit (INDEPENDENT_AMBULATORY_CARE_PROVIDER_SITE_OTHER): Payer: 59 | Admitting: Medical

## 2014-07-20 VITALS — BP 132/79 | HR 89 | Temp 98.7°F | Ht 65.0 in | Wt 121.0 lb

## 2014-07-20 DIAGNOSIS — R7989 Other specified abnormal findings of blood chemistry: Secondary | ICD-10-CM | POA: Diagnosis not present

## 2014-07-20 DIAGNOSIS — K589 Irritable bowel syndrome without diarrhea: Secondary | ICD-10-CM | POA: Diagnosis not present

## 2014-07-20 DIAGNOSIS — R945 Abnormal results of liver function studies: Secondary | ICD-10-CM

## 2014-07-20 MED ORDER — HYOSCYAMINE SULFATE ER 0.375 MG PO TB12: 0.3750 mg | ORAL_TABLET | Freq: Two times a day (BID) | ORAL | Status: DC | PRN

## 2014-07-20 NOTE — Progress Notes (Signed)
Subjective:    Patient ID: Miguel Love, male    DOB: 1990/12/30, 24 y.o.   MRN: [id number]  HPI  Pt in stating about 2 wks ago. His bm are getting little looser and less formed. Pt thinks may be related to sertraline and toprol. But no diarrhea reported. He does note hx of workup for this in the past. On review of work up for loose stools these results appeared negative.  Then recently just feeling bloated for couple of days. But also eating out a lot/restaurants. Then yesterday mild loose stool and used immodium one time. This did help. But again did not have diarrhea.  Sometimes after eating will have to use the bathroom. Greasy foods irritate his stomach just recently.  About one year ago did see GI for mild elevated liver enzymes. Pt drinking a lot less. No alcohol for about 2 wks.  Celiac work up in past negative. Stool panel studies were negative.  Pt also admits in past thought he may have ibs but back then was struggling more with mild constipation.  On review of old labs showed + hep b s antibody with only mild elevated lft. Other studies were neg. Appears no follow up with GI since that appointment.      Review of Systems  Constitutional: Negative for fever, chills and fatigue.  Respiratory: Negative for cough, chest tightness, shortness of breath and wheezing.   Cardiovascular: Negative for chest pain and palpitations.  Gastrointestinal: Positive for abdominal pain and diarrhea.       None today but faint abd pain  yesterday after a work out/ crunches.  More loose stools.  But mentioned to lpn occasional constipation.  Musculoskeletal: Negative for back pain.  Neurological: Negative for dizziness, speech difficulty, light-headedness and headaches.  Hematological: Negative for adenopathy. Does not bruise/bleed easily.   Past Medical History  Diagnosis Date  . Asthma   . Hypertension   . Palpitations   . Cardiac arrhythmia due to congenital heart disease    pt denies 12/25/12  . Gallbladder polyp   . Anxiety     History   Social History  . Marital Status: Single    Spouse Name: N/A  . Number of Children: 0  . Years of Education: N/A   Occupational History  .  [employer]   Social History Main Topics  . Smoking status: Never Smoker   . Smokeless tobacco: Never Used  . Alcohol Use: Yes     Comment: occassional  . Drug Use: No  . Sexual Activity: Yes   Other Topics Concern  . Not on file   Social History Narrative    No past surgical history on file.  Family History  Problem Relation Age of Onset  . Hypertension Father   . Lung cancer Maternal Grandfather   . Hypertension Paternal Grandmother   . Stroke Paternal Grandmother   . Hypertension Paternal Grandfather   . Colon polyps Father   . Colon cancer Paternal Grandfather     great grand    Allergies  Allergen Reactions  . Nsaids Shortness Of Breath    Asthma attack    Current Outpatient Prescriptions on File Prior to Visit  Medication Sig Dispense Refill  . Alum & Mag Hydroxide-Simeth (MAGIC MOUTHWASH W/LIDOCAINE) SOLN Take 5 mLs by mouth 4 (four) times daily as needed for mouth pain. 100 mL 0  . fluticasone (FLONASE) 50 MCG/ACT nasal spray Place 2 sprays into both nostrils daily. 16 g 0  .  loratadine (CLARITIN) 10 MG tablet Take 1 tablet (10 mg total) by mouth daily. 30 tablet 0  . metoprolol succinate (TOPROL-XL) 25 MG 24 hr tablet TAKE 1 TABLET EVERY DAY 30 tablet 6  . sertraline (ZOLOFT) 50 MG tablet Take 1 tablet (50 mg total) by mouth daily. 30 tablet 6   No current facility-administered medications on file prior to visit.    BP 132/79 mmHg  Pulse 89  Temp(Src) 98.7 F (37.1 C) (Oral)  Ht 5\' 5"  (1.651 m)  Wt 121 lb (54.885 kg)  BMI 20.14 kg/m2  SpO2 98%        Objective:   Physical Exam  General Appearance- Not in acute distress.  HEENT Eyes- Scleraeral/Conjuntiva-bilat- Not Yellow. Mouth & Throat- Normal.  Chest and Lung  Exam Auscultation: Breath sounds:-Normal. Adventitious sounds:- No Adventitious sounds.  Cardiovascular Auscultation:Rythm - Regular. Heart Sounds -Normal heart sounds.  Abdomen Inspection:-Inspection Normal.  Palpation/Perucssion: Palpation and Percussion of the abdomen reveal- Non Tender excpet for faint tenderness over rectus abdominus muscle rt side of umbilicus, No Rebound tenderness, No rigidity(Guarding) and No Palpable abdominal masses.  Liver:-Normal.  Spleen:- Normal.   Back- no cva tenderness.        Assessment & Plan:

## 2014-07-20 NOTE — Assessment & Plan Note (Signed)
Will get cbc and cmp. With hx of hep b S antibody + in past one year ago will refer you back to your prior GI.

## 2014-07-20 NOTE — Assessment & Plan Note (Signed)
Hx of this is possible based on largely negative work up in the past. First would not eat out for a while and eat very healthy. If loose stools persist/predominant feature then use levbid.  However would advise that if your are quickly alternating between constipation and loose stools then metamucil 1 tablespoon in 8 oz water 3 times daily would be preferred treatment.

## 2014-07-20 NOTE — Progress Notes (Signed)
Pre visit review using our clinic review tool, if applicable. No additional management support is needed unless otherwise documented below in the visit note. 

## 2014-07-20 NOTE — Patient Instructions (Signed)
IBS (irritable bowel syndrome) Hx of this is possible based on largely negative work up in the past. First would not eat out for a while and eat very healthy. If loose stools persist/predominant feature then use levbid.  However would advise that if your are quickly alternating between constipation and loose stools then metamucil 1 tablespoon in 8 oz water 3 times daily would be preferred treatment.   Elevated LFTs Will get cbc and cmp. With hx of hep b S antibody + in past one year ago will refer you back to your prior GI.      Follow up in 2 wks or as needed.

## 2014-07-21 ENCOUNTER — Telehealth: Payer: Self-pay | Admitting: Family Medicine

## 2014-07-21 LAB — COMPREHENSIVE METABOLIC PANEL
ALK PHOS: 51 U/L (ref 39–117)
ALT: 21 U/L (ref 0–53)
AST: 20 U/L (ref 0–37)
Albumin: 4.7 g/dL (ref 3.5–5.2)
BUN: 12 mg/dL (ref 6–23)
CHLORIDE: 103 meq/L (ref 96–112)
CO2: 30 mEq/L (ref 19–32)
CREATININE: 0.87 mg/dL (ref 0.40–1.50)
Calcium: 9.9 mg/dL (ref 8.4–10.5)
GFR: 112.63 mL/min (ref >60.00)
GLUCOSE: 52 mg/dL — AB (ref 70–99)
Potassium: 4 mEq/L (ref 3.5–5.1)
Sodium: 139 mEq/L (ref 135–145)
Total Bilirubin: 1 mg/dL (ref 0.2–1.2)
Total Protein: 7.4 g/dL (ref 6.0–8.3)

## 2014-07-21 LAB — CBC WITH DIFFERENTIAL/PLATELET
BASOS ABS: 0 10*3/uL (ref 0.0–0.1)
Basophils Relative: 0.7 % (ref 0.0–3.0)
EOS ABS: 0.1 10*3/uL (ref 0.0–0.7)
EOS PCT: 2.4 % (ref 0.0–5.0)
HCT: 50.7 % (ref 39.0–52.0)
Hemoglobin: 17.4 g/dL — ABNORMAL HIGH (ref 13.0–17.0)
LYMPHS ABS: 1.2 10*3/uL (ref 0.7–4.0)
Lymphocytes Relative: 26 % (ref 12.0–46.0)
MCHC: 34.3 g/dL (ref 30.0–36.0)
MCV: 90 fl (ref 78.0–100.0)
MONO ABS: 0.3 10*3/uL (ref 0.1–1.0)
MONOS PCT: 7.4 % (ref 3.0–12.0)
NEUTROS ABS: 3 10*3/uL (ref 1.4–7.7)
Neutrophils Relative %: 63.5 % (ref 43.0–77.0)
Platelets: 216 10*3/uL (ref 150.0–400.0)
RBC: 5.64 Mil/uL (ref 4.22–5.81)
RDW: 13.2 % (ref 11.5–15.5)
WBC: 4.7 10*3/uL (ref 4.0–10.5)

## 2014-07-21 NOTE — Telephone Encounter (Signed)
Janett Billow please disregard below message

## 2014-07-21 NOTE — Telephone Encounter (Signed)
Pt called for lab results. Please call back once resulted. Ph # [phone].

## 2014-07-21 NOTE — Telephone Encounter (Signed)
Relation to pt: self  Call back number: [phone]   Reason for call:  Pt inquiring about lab results taken 07/20/2014

## 2014-07-22 ENCOUNTER — Encounter: Payer: Self-pay | Admitting: Gastroenterology

## 2014-07-22 ENCOUNTER — Ambulatory Visit (INDEPENDENT_AMBULATORY_CARE_PROVIDER_SITE_OTHER): Payer: 59 | Admitting: Gastroenterology

## 2014-07-22 VITALS — BP 132/78 | HR 65 | Ht 65.0 in | Wt 120.1 lb

## 2014-07-22 DIAGNOSIS — R7989 Other specified abnormal findings of blood chemistry: Secondary | ICD-10-CM | POA: Diagnosis not present

## 2014-07-22 DIAGNOSIS — K589 Irritable bowel syndrome without diarrhea: Secondary | ICD-10-CM

## 2014-07-22 DIAGNOSIS — R1033 Periumbilical pain: Secondary | ICD-10-CM | POA: Diagnosis not present

## 2014-07-22 DIAGNOSIS — R945 Abnormal results of liver function studies: Principal | ICD-10-CM

## 2014-07-22 NOTE — Progress Notes (Signed)
    History of Present Illness: This is a 24 year old male complaining of intermittent urgent diarrhea and intermittent periumbilical abdominal pain. He was evaluated previously in December 2014 for similar symptoms and mildly elevated LFTs. He did not return for follow-up after that visit. Serologic testing for elevated LFTs was unremarkable. Abdominal ultrasound in November 2014 showed a small gallbladder wall polyp and 41mm hyperechoic area within the liver unchanged from prior imaging studies which was felt to be a hemangioma. His LFTs have returned to normal. Testing for celiac disease was negative in 12/2012. He relates intermittent difficulties with postprandial periumbilical crampy pain and urgent loose stools. He may have 3-4 bowel movements a day on some days and typically he has 2-3 bowel movements per day. Denies weight loss, constipation, change in stool caliber, melena, hematochezia, nausea, vomiting, dysphagia, reflux symptoms, chest pain.   Current Medications, Allergies, Past Medical History, Past Surgical History, Family History and Social History were reviewed in [employer] record.  Physical Exam: General: Well developed , well nourished, no acute distress Head: Normocephalic and atraumatic Eyes:  sclerae anicteric, EOMI Ears: Normal auditory acuity Mouth: No deformity or lesions Lungs: Clear throughout to auscultation Heart: Regular rate and rhythm; no murmurs, rubs or bruits Abdomen: Soft, non tender and non distended. No masses, hepatosplenomegaly or hernias noted. Normal Bowel sounds Musculoskeletal: Symmetrical with no gross deformities  Pulses:  Normal pulses noted Extremities: No clubbing, cyanosis, edema or deformities noted Neurological: Alert oriented x 4, grossly nonfocal Psychological:  Alert and cooperative. Anxious.  Assessment and Recommendations:  1. Presumed IBS-D. Avoid any foods that trigger symptoms. Maintain a lactose-free low-fat  diet. Trial of a low FODMAP diet and if not helpful then a gluten-free diet. Start Levbid twice daily as needed as previously prescribed. Anxiety may be contributing to his symptoms. If symptoms are not improved consider further evaluation. REV in 6 weeks.   2. Abnormal LFTs. This problem has resolved. Etiology unclear. Recommend his PCP to recheck LFTs in 6-12 months.

## 2014-07-22 NOTE — Patient Instructions (Signed)
Start your prescription of Levbid that was provided by your Primary care physician.   You have been given a [place] diet to follow.   Please follow up with Dr. Fuller Plan on 09/03/14 at 9:45am. If you need to reschedule or cancel please call our office at [phone].  Thank you for choosing me and [facility].  Pricilla Riffle. Dagoberto Ligas., MD., Marval Regal  cc: Annye Asa, MD

## 2014-07-22 NOTE — Telephone Encounter (Signed)
Pt last seen edward. Please cal and discuss with pt

## 2014-09-03 ENCOUNTER — Ambulatory Visit: Payer: 59 | Admitting: Gastroenterology

## 2014-09-04 ENCOUNTER — Encounter: Payer: Self-pay | Admitting: Internal Medicine

## 2014-09-04 ENCOUNTER — Encounter: Payer: Self-pay | Admitting: Cardiovascular Disease

## 2014-09-11 ENCOUNTER — Telehealth: Payer: Self-pay | Admitting: Family Medicine

## 2014-09-11 NOTE — Telephone Encounter (Signed)
Relation to AE:WYBR Call back number:[phone] Pharmacy: [place], [place] [facility] [place] [phone] (Phone) [phone] (Fax)         Reason for call:  Patient requesting a refill hyoscyamine (LEVBID) 0.375 MG 12 hr tablet. Patient states he is going out of town and would like to pick up Rx this afternoon. Informed patient in the future please call a few days in advance.

## 2014-09-11 NOTE — Telephone Encounter (Signed)
Relation to pt: self  Call back number:[phone]   Reason for call:  Patient calling checking on the status of medication request

## 2014-09-15 MED ORDER — HYOSCYAMINE SULFATE ER 0.375 MG PO TB12: 0.3750 mg | ORAL_TABLET | Freq: Two times a day (BID) | ORAL | Status: DC | PRN

## 2014-09-15 NOTE — Telephone Encounter (Signed)
I was not in last week, seen this request today and med was filled.

## 2014-10-07 ENCOUNTER — Encounter: Payer: Self-pay | Admitting: Family Medicine

## 2014-10-07 ENCOUNTER — Ambulatory Visit (INDEPENDENT_AMBULATORY_CARE_PROVIDER_SITE_OTHER): Payer: 59 | Admitting: Family Medicine

## 2014-10-07 VITALS — BP 126/76 | HR 68 | Temp 98.1°F | Resp 16 | Ht 65.0 in | Wt 120.2 lb

## 2014-10-07 DIAGNOSIS — Z0000 Encounter for general adult medical examination without abnormal findings: Secondary | ICD-10-CM | POA: Diagnosis not present

## 2014-10-07 MED ORDER — METOPROLOL SUCCINATE ER 25 MG PO TB24: 25.0000 mg | ORAL_TABLET | Freq: Every day | ORAL | Status: DC

## 2014-10-07 MED ORDER — SERTRALINE HCL 50 MG PO TABS: 50.0000 mg | ORAL_TABLET | Freq: Every day | ORAL | Status: DC

## 2014-10-07 NOTE — Progress Notes (Signed)
Pre visit review using our clinic review tool, if applicable. No additional management support is needed unless otherwise documented below in the visit note. 

## 2014-10-07 NOTE — Progress Notes (Signed)
   Subjective:    Patient ID: Miguel Love, male    DOB: 28-Jan-1990, 24 y.o.   MRN: [id number]  HPI CPE- no concerns today.  Working as Horticulturist, commercial ICU   Review of Systems Patient reports no vision/hearing changes, anorexia, fever ,adenopathy, persistant/recurrent hoarseness, swallowing issues, chest pain, palpitations, edema, persistant/recurrent cough, hemoptysis, dyspnea (rest,exertional, paroxysmal nocturnal), gastrointestinal  bleeding (melena, rectal bleeding), abdominal pain, excessive heart burn, GU symptoms (dysuria, hematuria, voiding/incontinence issues) syncope, focal weakness, memory loss, numbness & tingling, skin/hair/nail changes, depression, anxiety, abnormal bruising/bleeding, musculoskeletal symptoms/signs.     Objective:   Physical Exam General Appearance:    Alert, cooperative, no distress, appears stated age  Head:    Normocephalic, without obvious abnormality, atraumatic  Eyes:    PERRL, conjunctiva/corneas clear, EOM's intact, fundi    benign, both eyes       Ears:    Normal TM's and external ear canals, both ears  Nose:   Nares normal, septum midline, mucosa normal, no drainage   or sinus tenderness  Throat:   Lips, mucosa, and tongue normal; teeth and gums normal  Neck:   Supple, symmetrical, trachea midline, no adenopathy;       thyroid:  No enlargement/tenderness/nodules  Back:     Symmetric, no curvature, ROM normal, no CVA tenderness  Lungs:     Clear to auscultation bilaterally, respirations unlabored  Chest wall:    No tenderness or deformity  Heart:    Regular rate and rhythm, S1 and S2 normal, no murmur, rub   or gallop  Abdomen:     Soft, non-tender, bowel sounds active all four quadrants,    no masses, no organomegaly  Genitalia:    Normal male without lesion, masses,discharge or tenderness  Rectal:    Deferred due to young age  Extremities:   Extremities normal, atraumatic, no cyanosis or edema  Pulses:   2+ and symmetric all extremities  Skin:    Skin color, texture, turgor normal, no rashes or lesions  Lymph nodes:   Cervical, supraclavicular, and axillary nodes normal  Neurologic:   CNII-XII intact. Normal strength, sensation and reflexes      throughout          Assessment & Plan:

## 2014-10-07 NOTE — Patient Instructions (Signed)
Follow up in 1 year or as needed We'll notify you of your lab results and make any changes if needed Keep up the good work!  You look great! Call with any questions or concerns If you want to join [place] at the new [place] office, any scheduled appointments will automatically transfer and we will see you at [address] [place] [address] [place], [place] [address]  Happy Fall!!!

## 2014-10-08 LAB — HEPATIC FUNCTION PANEL
ALK PHOS: 61 U/L (ref 39–117)
ALT: 32 U/L (ref 0–53)
AST: 22 U/L (ref 0–37)
Albumin: 4.6 g/dL (ref 3.5–5.2)
BILIRUBIN DIRECT: 0.1 mg/dL (ref 0.0–0.3)
BILIRUBIN TOTAL: 0.6 mg/dL (ref 0.2–1.2)
Total Protein: 7.4 g/dL (ref 6.0–8.3)

## 2014-10-08 LAB — CBC WITH DIFFERENTIAL/PLATELET
BASOS PCT: 0.7 % (ref 0.0–3.0)
Basophils Absolute: 0 10*3/uL (ref 0.0–0.1)
EOS ABS: 0.1 10*3/uL (ref 0.0–0.7)
Eosinophils Relative: 2.3 % (ref 0.0–5.0)
HCT: 47.5 % (ref 39.0–52.0)
Hemoglobin: 16.1 g/dL (ref 13.0–17.0)
Lymphocytes Relative: 37.2 % (ref 12.0–46.0)
Lymphs Abs: 2.2 10*3/uL (ref 0.7–4.0)
MCHC: 33.9 g/dL (ref 30.0–36.0)
MCV: 89.2 fl (ref 78.0–100.0)
MONO ABS: 0.4 10*3/uL (ref 0.1–1.0)
Monocytes Relative: 6.5 % (ref 3.0–12.0)
NEUTROS ABS: 3.2 10*3/uL (ref 1.4–7.7)
Neutrophils Relative %: 53.3 % (ref 43.0–77.0)
PLATELETS: 228 10*3/uL (ref 150.0–400.0)
RBC: 5.33 Mil/uL (ref 4.22–5.81)
RDW: 12.3 % (ref 11.5–15.5)
WBC: 6 10*3/uL (ref 4.0–10.5)

## 2014-10-08 LAB — TSH: TSH: 1.85 u[IU]/mL (ref 0.35–4.50)

## 2014-10-08 LAB — BASIC METABOLIC PANEL
BUN: 12 mg/dL (ref 6–23)
CHLORIDE: 100 meq/L (ref 96–112)
CO2: 29 meq/L (ref 19–32)
Calcium: 9.8 mg/dL (ref 8.4–10.5)
Creatinine, Ser: 0.93 mg/dL (ref 0.40–1.50)
GFR: 104.12 mL/min (ref >60.00)
Glucose, Bld: 110 mg/dL — ABNORMAL HIGH (ref 70–99)
POTASSIUM: 3.5 meq/L (ref 3.5–5.1)
SODIUM: 138 meq/L (ref 135–145)

## 2014-10-08 LAB — LIPID PANEL
Cholesterol: 123 mg/dL (ref 0–200)
HDL: 62.6 mg/dL (ref >39.00)
LDL Cholesterol: 51 mg/dL (ref 0–99)
NONHDL: 60.62
Total CHOL/HDL Ratio: 2
Triglycerides: 47 mg/dL (ref 0.0–149.0)
VLDL: 9.4 mg/dL (ref 0.0–40.0)

## 2014-10-08 NOTE — Assessment & Plan Note (Addendum)
Pt's PE WNL.  Check labs.  Pt to get flu shot at work.  Anticipatory guidance provided.

## 2015-01-14 MED FILL — METOPROLOL SUCC ER 25 MG TA: 25 | 90 days supply | Qty: 90 | Fill #1

## 2015-01-14 MED FILL — SERTRALINE HCL 50 MG TABLET: 50 | 90 days supply | Qty: 90 | Fill #1

## 2015-02-17 ENCOUNTER — Telehealth: Payer: Self-pay | Admitting: Family Medicine

## 2015-02-17 NOTE — Telephone Encounter (Signed)
[employer] employee - received flu shot in Sept 2016

## 2015-02-18 NOTE — Telephone Encounter (Signed)
Chart updated to reflect 

## 2015-04-26 ENCOUNTER — Telehealth: Payer: Self-pay | Admitting: Family Medicine

## 2015-04-26 MED ORDER — SERTRALINE HCL 50 MG PO TABS: 50.0000 mg | ORAL_TABLET | Freq: Every day | ORAL | Status: DC

## 2015-04-26 MED FILL — SERTRALINE HCL 50 MG TABLET: 50 | 90 days supply | Qty: 90 | Fill #0

## 2015-04-26 NOTE — Telephone Encounter (Signed)
Refill request on sertraline Rx.   Pharmacy: [place], [place]: [phone]

## 2015-04-26 NOTE — Telephone Encounter (Signed)
Medication filled to pharmacy as requested.   

## 2015-05-18 MED FILL — METOPROLOL SUCC ER 25 MG TA: 25 | 90 days supply | Qty: 90 | Fill #2

## 2015-06-29 ENCOUNTER — Encounter: Payer: Self-pay | Admitting: Physician Assistant

## 2015-06-29 ENCOUNTER — Ambulatory Visit (INDEPENDENT_AMBULATORY_CARE_PROVIDER_SITE_OTHER): Payer: 59 | Admitting: Physician Assistant

## 2015-06-29 VITALS — BP 122/88 | HR 76 | Temp 98.2°F | Resp 16 | Ht 65.0 in | Wt 130.1 lb

## 2015-06-29 DIAGNOSIS — R102 Pelvic and perineal pain: Secondary | ICD-10-CM | POA: Diagnosis not present

## 2015-06-29 DIAGNOSIS — Z113 Encounter for screening for infections with a predominantly sexual mode of transmission: Secondary | ICD-10-CM | POA: Diagnosis not present

## 2015-06-29 LAB — POCT URINALYSIS DIPSTICK
Bilirubin, UA: NEGATIVE
Blood, UA: NEGATIVE
Glucose, UA: NEGATIVE
Ketones, UA: NEGATIVE
Leukocytes, UA: NEGATIVE
Nitrite, UA: NEGATIVE
Protein, UA: NEGATIVE
Spec Grav, UA: 1.015
Urobilinogen, UA: 0.2
pH, UA: 6

## 2015-06-29 NOTE — Patient Instructions (Signed)
Go easy on the running for now.  I believe you have inflamed your pelvic muscles.  Take Tylenol as directed when needed with pain. Apply Icy hot to the area.  Refrain from intercourse until results are in. Stay hydrated.  Start a daily probiotic. We will alter regimen if indicated by results.

## 2015-06-29 NOTE — Progress Notes (Signed)
Patient presents to clinic today c/o groin pain and dysuria. Has been running over the past 2 weeks. Endorses pain in the left groin and perineal region that is aching and "tight". Does note some mild burning with urination over the past week but is now resolving. Denies hematuria. Notes some urgency initially that has resolved.  Endorses recent sexually activity. Has a single male partner. Has not been consistently using condoms. Denies penile lesion, pain or discharge.  Past Medical History  Diagnosis Date  . Asthma   . Hypertension   . Palpitations   . Cardiac arrhythmia due to congenital heart disease     pt denies 12/25/12  . Gallbladder polyp   . Anxiety     Current Outpatient Prescriptions on File Prior to Visit  Medication Sig Dispense Refill  . fluticasone (FLONASE) 50 MCG/ACT nasal spray Place 2 sprays into both nostrils daily. 16 g 0  . hyoscyamine (LEVBID) 0.375 MG 12 hr tablet Take 1 tablet (0.375 mg total) by mouth every 12 (twelve) hours as needed. 60 tablet 0  . loratadine (CLARITIN) 10 MG tablet Take 1 tablet (10 mg total) by mouth daily. 30 tablet 0  . metoprolol succinate (TOPROL-XL) 25 MG 24 hr tablet Take 1 tablet (25 mg total) by mouth daily. 90 tablet 1  . sertraline (ZOLOFT) 50 MG tablet Take 1 tablet (50 mg total) by mouth daily. 90 tablet 1   No current facility-administered medications on file prior to visit.    Allergies  Allergen Reactions  . Nsaids Shortness Of Breath    Asthma attack    Family History  Problem Relation Age of Onset  . Hypertension Father   . Lung cancer Maternal Grandfather   . Hypertension Paternal Grandmother   . Stroke Paternal Grandmother   . Hypertension Paternal Grandfather   . Colon polyps Father   . Colon cancer Paternal Grandfather     great grand    Social History   Social History  . Marital Status: Single    Spouse Name: N/A  . Number of Children: 0  . Years of Education: N/A   Occupational History  .      Social History Main Topics  . Smoking status: Never Smoker   . Smokeless tobacco: Never Used  . Alcohol Use: Yes     Comment: occassional  . Drug Use: No  . Sexual Activity: Yes   Other Topics Concern  . None   Social History Narrative   Review of Systems - See HPI.  All other ROS are negative.  BP 122/88 mmHg  Pulse 76  Temp(Src) 98.2 F (36.8 C) (Oral)  Resp 16  Ht 5\' 5"  (1.651 m)  Wt 130 lb 2 oz (59.024 kg)  BMI 21.65 kg/m2  SpO2 99%  Physical Exam  Constitutional: He is oriented to person, place, and time and well-developed, well-nourished, and in no distress.  HENT:  Head: Normocephalic and atraumatic.  Eyes: Conjunctivae are normal.  Cardiovascular: Normal rate, regular rhythm, normal heart sounds and intact distal pulses.   Pulmonary/Chest: Effort normal and breath sounds normal.  Abdominal: No hernia.  Genitourinary: Testes/scrotum normal and penis normal. No discharge found.  Neurological: He is alert and oriented to person, place, and time.  Skin: Skin is warm and dry. No rash noted.  Vitals reviewed.  Assessment/Plan: 1. Pelvic pain in male Suspect pellvic muscle strain and spasm. Limit running. Tylenol as directed. Suppotive undergarments as directed. Heating pad. May need Muscle relaxer or  PT if continuing. Urine dip negative. Gential examination within normal limits. Burning has resolved on its own and suspect it may be just irritation from frequent intercourse. Will check Gc/chlamydia today.  - POCT urinalysis dipstick; Standing   2. Routine screening for STI (sexually transmitted infection) Is sexually active with 1 male partner. Denies concern for STI. Is wanting routine screen. - Acute Hep Panel & Hep B Surface Ab - GC/chlamydia probe amp, urine - HIV antibody - HSV(herpes smplx)abs-1+2(IgG+IgM)-bld - RPR   Leeanne Rio, PA-C

## 2015-06-29 NOTE — Progress Notes (Signed)
Pre visit review using our clinic review tool, if applicable. No additional management support is needed unless otherwise documented below in the visit note/SLS  

## 2015-06-30 LAB — ACUTE HEP PANEL AND HEP B SURFACE AB
HCV Ab: NEGATIVE
HEP B S AB: POSITIVE — AB
HEP B S AG: NEGATIVE
Hep A IgM: NONREACTIVE
Hep B C IgM: NONREACTIVE

## 2015-06-30 LAB — RPR

## 2015-06-30 LAB — HIV ANTIBODY (ROUTINE TESTING W REFLEX): HIV: NONREACTIVE

## 2015-07-01 ENCOUNTER — Telehealth: Payer: Self-pay | Admitting: Emergency Medicine

## 2015-07-01 LAB — HSV(HERPES SMPLX)ABS-I+II(IGG+IGM)-BLD
HSV 1 GLYCOPROTEIN G AB, IGG: 22.4 {index} — AB (ref <0.90)
HSV 2 GLYCOPROTEIN G AB, IGG: 1.31 {index} — AB (ref <0.90)
Herpes Simplex Vrs I&II-IgM Ab (EIA): 2.53 INDEX — ABNORMAL HIGH

## 2015-07-01 NOTE — Telephone Encounter (Signed)
Please contact the patient to inform him of the mistake -- he gave a urine sample during OV so I am assuming it was not taken to the lab or got lost in translation. We will need a repeat urine sample if he can just drop by the lab for a quick sample.

## 2015-07-01 NOTE — Telephone Encounter (Signed)
Debra from [facility] called and said that no urine was sent on 06-29-15 for GC/Chlamydia for Mr.Miguel Love......KMP

## 2015-07-01 NOTE — Telephone Encounter (Signed)
I called the patient and did not get an answer, will return call again...KMP

## 2015-07-02 ENCOUNTER — Other Ambulatory Visit: Payer: 59

## 2015-07-02 ENCOUNTER — Other Ambulatory Visit: Payer: Self-pay | Admitting: Physician Assistant

## 2015-07-02 DIAGNOSIS — Z113 Encounter for screening for infections with a predominantly sexual mode of transmission: Secondary | ICD-10-CM | POA: Diagnosis not present

## 2015-07-02 NOTE — Telephone Encounter (Signed)
Ok. Please reach out to him again so we can get this taken care of ASAP.

## 2015-07-02 NOTE — Telephone Encounter (Signed)
I spoke with patient he understands and will be in today....KMP

## 2015-07-06 ENCOUNTER — Encounter: Payer: Self-pay | Admitting: Family

## 2015-07-06 LAB — GC/CHLAMYDIA PROBE AMP
CT Probe RNA: NOT DETECTED
GC Probe RNA: NOT DETECTED

## 2015-08-02 ENCOUNTER — Other Ambulatory Visit: Payer: Self-pay | Admitting: Family Medicine

## 2015-08-02 MED FILL — METOPROLOL SUCC ER 25 MG TA: 25 | 90 days supply | Qty: 90 | Fill #0

## 2015-08-02 MED FILL — SERTRALINE HCL 50 MG TABLET: 50 | 90 days supply | Qty: 90 | Fill #1

## 2015-11-02 DIAGNOSIS — Z0100 Encounter for examination of eyes and vision without abnormal findings: Secondary | ICD-10-CM | POA: Diagnosis not present

## 2015-11-24 ENCOUNTER — Other Ambulatory Visit: Payer: Self-pay | Admitting: Family Medicine

## 2015-11-24 MED FILL — METOPROLOL SUCC ER 25 MG TA: 25 | 90 days supply | Qty: 90 | Fill #1

## 2015-11-24 MED FILL — SERTRALINE HCL 50 MG TABLET: 50 | 90 days supply | Qty: 90 | Fill #0

## 2016-03-10 MED FILL — SERTRALINE HCL 50 MG TABLET: 50 | 90 days supply | Qty: 90 | Fill #1

## 2016-03-10 MED FILL — METOPROLOL SUCC ER 25 MG TA: 25 | 30 days supply | Qty: 30 | Fill #2

## 2016-05-01 ENCOUNTER — Other Ambulatory Visit: Payer: Self-pay | Admitting: Family Medicine

## 2016-05-01 MED FILL — METOPROLOL SUCC ER 25 MG TA: 25 | 30 days supply | Qty: 30 | Fill #0

## 2016-06-16 ENCOUNTER — Other Ambulatory Visit: Payer: Self-pay | Admitting: Family Medicine

## 2016-06-16 NOTE — Telephone Encounter (Signed)
Pt has not been seen by PCP since 10/07/2014

## 2016-06-19 ENCOUNTER — Other Ambulatory Visit: Payer: Self-pay | Admitting: General Practice

## 2016-06-19 MED ORDER — SERTRALINE HCL 50 MG PO TABS: 50.0000 mg | ORAL_TABLET | Freq: Every day | ORAL | 0 refills | Status: DC

## 2016-06-19 MED ORDER — METOPROLOL SUCCINATE ER 25 MG PO TB24: 25.0000 mg | ORAL_TABLET | Freq: Every day | ORAL | 0 refills | Status: DC

## 2016-06-19 MED FILL — SERTRALINE HCL 50 MG TABLET: 50 | 30 days supply | Qty: 30 | Fill #0

## 2016-06-19 MED FILL — METOPROLOL SUCC ER 25 MG TA: 25 | 30 days supply | Qty: 30 | Fill #0

## 2016-06-19 NOTE — Telephone Encounter (Signed)
Smoot for #30 of each

## 2016-06-19 NOTE — Telephone Encounter (Signed)
Noted filled #30

## 2016-06-19 NOTE — Telephone Encounter (Signed)
Pt scheduled CPE on 6/18

## 2016-06-26 ENCOUNTER — Encounter: Payer: Self-pay | Admitting: Family Medicine

## 2016-06-26 ENCOUNTER — Ambulatory Visit (INDEPENDENT_AMBULATORY_CARE_PROVIDER_SITE_OTHER): Payer: 59 | Admitting: Family Medicine

## 2016-06-26 VITALS — BP 110/80 | HR 54 | Temp 98.5°F | Resp 16 | Ht 65.0 in | Wt 136.4 lb

## 2016-06-26 DIAGNOSIS — Z0000 Encounter for general adult medical examination without abnormal findings: Secondary | ICD-10-CM | POA: Diagnosis not present

## 2016-06-26 LAB — CBC WITH DIFFERENTIAL/PLATELET
BASOS ABS: 58 {cells}/uL (ref 0–200)
Basophils Relative: 1 %
EOS ABS: 116 {cells}/uL (ref 15–500)
Eosinophils Relative: 2 %
HCT: 48.2 % (ref 38.5–50.0)
HEMOGLOBIN: 17 g/dL (ref 13.2–17.1)
LYMPHS ABS: 2146 {cells}/uL (ref 850–3900)
Lymphocytes Relative: 37 %
MCH: 30.7 pg (ref 27.0–33.0)
MCHC: 35.3 g/dL (ref 32.0–36.0)
MCV: 87 fL (ref 80.0–100.0)
MPV: 9.2 fL (ref 7.5–12.5)
Monocytes Absolute: 348 cells/uL (ref 200–950)
Monocytes Relative: 6 %
NEUTROS ABS: 3132 {cells}/uL (ref 1500–7800)
NEUTROS PCT: 54 %
Platelets: 225 10*3/uL (ref 140–400)
RBC: 5.54 MIL/uL (ref 4.20–5.80)
RDW: 12.5 % (ref 11.0–15.0)
WBC: 5.8 10*3/uL (ref 3.8–10.8)

## 2016-06-26 LAB — HEPATIC FUNCTION PANEL
ALK PHOS: 58 U/L (ref 40–115)
ALT: 43 U/L (ref 9–46)
AST: 28 U/L (ref 10–40)
Albumin: 4.9 g/dL (ref 3.6–5.1)
BILIRUBIN INDIRECT: 0.6 mg/dL (ref 0.2–1.2)
BILIRUBIN TOTAL: 0.7 mg/dL (ref 0.2–1.2)
Bilirubin, Direct: 0.1 mg/dL (ref <=0.2)
TOTAL PROTEIN: 7.7 g/dL (ref 6.1–8.1)

## 2016-06-26 LAB — BASIC METABOLIC PANEL
BUN: 11 mg/dL (ref 7–25)
CHLORIDE: 103 mmol/L (ref 98–110)
CO2: 23 mmol/L (ref 20–31)
Calcium: 9.7 mg/dL (ref 8.6–10.3)
Creat: 0.99 mg/dL (ref 0.60–1.35)
GLUCOSE: 101 mg/dL — AB (ref 65–99)
POTASSIUM: 3.2 mmol/L — AB (ref 3.5–5.3)
SODIUM: 139 mmol/L (ref 135–146)

## 2016-06-26 LAB — LIPID PANEL
CHOL/HDL RATIO: 2.4 ratio (ref <5.0)
CHOLESTEROL: 153 mg/dL (ref <200)
HDL: 64 mg/dL (ref >40)
LDL Cholesterol: 79 mg/dL (ref <100)
Triglycerides: 50 mg/dL (ref <150)
VLDL: 10 mg/dL (ref <30)

## 2016-06-26 LAB — TSH: TSH: 2.42 mIU/L (ref 0.40–4.50)

## 2016-06-26 NOTE — Progress Notes (Signed)
Pre visit review using our clinic review tool, if applicable. No additional management support is needed unless otherwise documented below in the visit note. 

## 2016-06-26 NOTE — Patient Instructions (Signed)
Follow up in 1 year or as needed We'll notify you of your lab results and make any changes if needed Continue to work on healthy diet and regular exercise- you look great! Call with any questions or concerns Have a great summer! CONGRATS!!!

## 2016-06-26 NOTE — Assessment & Plan Note (Signed)
Pt's PE WNL.  UTD on Tdap.  Check labs.  Anticipatory guidance provided.  

## 2016-06-26 NOTE — Progress Notes (Signed)
   Subjective:    Patient ID: Miguel Love, male    DOB: [birth year], 26 y.o.   MRN: [id number]  HPI CPE- UTD on Tdap.  No concerns today.   Review of Systems Patient reports no vision/hearing changes, anorexia, fever ,adenopathy, persistant/recurrent hoarseness, swallowing issues, chest pain, palpitations, edema, persistant/recurrent cough, hemoptysis, dyspnea (rest,exertional, paroxysmal nocturnal), gastrointestinal  bleeding (melena, rectal bleeding), abdominal pain, excessive heart burn, GU symptoms (dysuria, hematuria, voiding/incontinence issues) syncope, focal weakness, memory loss, numbness & tingling, skin/hair/nail changes, depression, anxiety, abnormal bruising/bleeding, musculoskeletal symptoms/signs.     Objective:   Physical Exam General Appearance:    Alert, cooperative, no distress, appears stated age  Head:    Normocephalic, without obvious abnormality, atraumatic  Eyes:    PERRL, conjunctiva/corneas clear, EOM's intact, fundi    benign, both eyes       Ears:    Normal TM's and external ear canals, both ears  Nose:   Nares normal, septum midline, mucosa normal, no drainage   or sinus tenderness  Throat:   Lips, mucosa, and tongue normal; teeth and gums normal  Neck:   Supple, symmetrical, trachea midline, no adenopathy;       thyroid:  No enlargement/tenderness/nodules  Back:     Symmetric, no curvature, ROM normal, no CVA tenderness  Lungs:     Clear to auscultation bilaterally, respirations unlabored  Chest wall:    No tenderness or deformity  Heart:    Regular rate and rhythm, S1 and S2 normal, no murmur, rub   or gallop  Abdomen:     Soft, non-tender, bowel sounds active all four quadrants,    no masses, no organomegaly  Genitalia:    Normal male without lesion, masses,discharge or tenderness  Rectal:    Deferred due to young age  Extremities:   Extremities normal, atraumatic, no cyanosis or edema  Pulses:   2+ and symmetric all extremities  Skin:   Skin color,  texture, turgor normal, no rashes or lesions  Lymph nodes:   Cervical, supraclavicular, and axillary nodes normal  Neurologic:   CNII-XII intact. Normal strength, sensation and reflexes      throughout          Assessment & Plan:

## 2016-06-27 ENCOUNTER — Encounter: Payer: Self-pay | Admitting: General Practice

## 2016-07-26 ENCOUNTER — Other Ambulatory Visit: Payer: Self-pay | Admitting: Family Medicine

## 2016-07-26 MED FILL — METOPROLOL SUCC ER 25 MG TA: 25 | 30 days supply | Qty: 30 | Fill #0

## 2016-08-14 ENCOUNTER — Other Ambulatory Visit: Payer: Self-pay | Admitting: Family Medicine

## 2016-08-14 MED FILL — SERTRALINE HCL 50 MG TABLET: 50 | 90 days supply | Qty: 90 | Fill #0

## 2016-09-15 MED FILL — METOPROLOL SUCC ER 25 MG TA: 25 | 30 days supply | Qty: 30 | Fill #1

## 2016-10-30 MED FILL — METOPROLOL SUCC ER 25 MG TA: 25 | 30 days supply | Qty: 30 | Fill #2

## 2016-12-05 MED FILL — METOPROLOL SUCC ER 25 MG TA: 25 | 30 days supply | Qty: 30 | Fill #3

## 2016-12-05 MED FILL — SERTRALINE HCL 50 MG TABLET: 50 | 90 days supply | Qty: 90 | Fill #1

## 2016-12-14 DIAGNOSIS — Z0100 Encounter for examination of eyes and vision without abnormal findings: Secondary | ICD-10-CM | POA: Diagnosis not present

## 2017-01-15 MED FILL — METOPROLOL SUCC ER 25 MG TA: 25 | 90 days supply | Qty: 90 | Fill #4

## 2017-05-01 ENCOUNTER — Other Ambulatory Visit: Payer: Self-pay | Admitting: Family Medicine

## 2017-05-01 MED FILL — SERTRALINE HCL 50 MG TABLET: 50 | 90 days supply | Qty: 90 | Fill #0

## 2017-05-16 ENCOUNTER — Other Ambulatory Visit: Payer: Self-pay | Admitting: Family Medicine

## 2017-05-16 MED FILL — METOPROLOL SUCCINATE ER 25: 25 | 90 days supply | Qty: 90 | Fill #0

## 2017-06-28 ENCOUNTER — Ambulatory Visit (INDEPENDENT_AMBULATORY_CARE_PROVIDER_SITE_OTHER): Payer: 59 | Admitting: Family Medicine

## 2017-06-28 ENCOUNTER — Other Ambulatory Visit: Payer: Self-pay

## 2017-06-28 ENCOUNTER — Encounter: Payer: Self-pay | Admitting: Family Medicine

## 2017-06-28 VITALS — BP 112/78 | HR 69 | Temp 98.8°F | Resp 16 | Ht 65.5 in | Wt 142.2 lb

## 2017-06-28 DIAGNOSIS — Z0000 Encounter for general adult medical examination without abnormal findings: Secondary | ICD-10-CM | POA: Diagnosis not present

## 2017-06-28 DIAGNOSIS — L659 Nonscarring hair loss, unspecified: Secondary | ICD-10-CM

## 2017-06-28 DIAGNOSIS — J302 Other seasonal allergic rhinitis: Secondary | ICD-10-CM

## 2017-06-28 DIAGNOSIS — I10 Essential (primary) hypertension: Secondary | ICD-10-CM

## 2017-06-28 LAB — CBC WITH DIFFERENTIAL/PLATELET
BASOS PCT: 0.9 % (ref 0.0–3.0)
Basophils Absolute: 0 10*3/uL (ref 0.0–0.1)
EOS PCT: 1.5 % (ref 0.0–5.0)
Eosinophils Absolute: 0.1 10*3/uL (ref 0.0–0.7)
HEMATOCRIT: 48.8 % (ref 39.0–52.0)
Hemoglobin: 17 g/dL (ref 13.0–17.0)
Lymphocytes Relative: 27.1 % (ref 12.0–46.0)
Lymphs Abs: 1.4 10*3/uL (ref 0.7–4.0)
MCHC: 34.9 g/dL (ref 30.0–36.0)
MCV: 87.5 fl (ref 78.0–100.0)
MONO ABS: 0.3 10*3/uL (ref 0.1–1.0)
Monocytes Relative: 6.2 % (ref 3.0–12.0)
Neutro Abs: 3.3 10*3/uL (ref 1.4–7.7)
Neutrophils Relative %: 64.3 % (ref 43.0–77.0)
Platelets: 221 10*3/uL (ref 150.0–400.0)
RBC: 5.57 Mil/uL (ref 4.22–5.81)
RDW: 12.8 % (ref 11.5–15.5)
WBC: 5.1 10*3/uL (ref 4.0–10.5)

## 2017-06-28 LAB — BASIC METABOLIC PANEL
BUN: 13 mg/dL (ref 6–23)
CALCIUM: 9.8 mg/dL (ref 8.4–10.5)
CO2: 25 meq/L (ref 19–32)
Chloride: 101 mEq/L (ref 96–112)
Creatinine, Ser: 1.03 mg/dL (ref 0.40–1.50)
GFR: 90.71 mL/min (ref >60.00)
GLUCOSE: 110 mg/dL — AB (ref 70–99)
Potassium: 3.3 mEq/L — ABNORMAL LOW (ref 3.5–5.1)
Sodium: 139 mEq/L (ref 135–145)

## 2017-06-28 LAB — HEPATIC FUNCTION PANEL
ALBUMIN: 5.1 g/dL (ref 3.5–5.2)
ALK PHOS: 64 U/L (ref 39–117)
ALT: 51 U/L (ref 0–53)
AST: 31 U/L (ref 0–37)
Bilirubin, Direct: 0.1 mg/dL (ref 0.0–0.3)
Total Bilirubin: 0.6 mg/dL (ref 0.2–1.2)
Total Protein: 7.6 g/dL (ref 6.0–8.3)

## 2017-06-28 LAB — LIPID PANEL
CHOLESTEROL: 166 mg/dL (ref 0–200)
HDL: 62 mg/dL (ref >39.00)
LDL CALC: 82 mg/dL (ref 0–99)
NonHDL: 103.74
Total CHOL/HDL Ratio: 3
Triglycerides: 107 mg/dL (ref 0.0–149.0)
VLDL: 21.4 mg/dL (ref 0.0–40.0)

## 2017-06-28 LAB — TSH: TSH: 1.69 u[IU]/mL (ref 0.35–4.50)

## 2017-06-28 MED ORDER — FLUTICASONE PROPIONATE 50 MCG/ACT NA SUSP: 2.0000 | Freq: Every day | NASAL | 3 refills | Status: DC

## 2017-06-28 MED ORDER — SERTRALINE HCL 25 MG PO TABS: 25.0000 mg | ORAL_TABLET | Freq: Every day | ORAL | 3 refills | Status: DC

## 2017-06-28 NOTE — Assessment & Plan Note (Signed)
Pt's PE WNL.  UTD on immunizations.  Check labs.  Anticipatory guidance provided.  

## 2017-06-28 NOTE — Patient Instructions (Signed)
Follow up in 1 year or as needed We'll notify you of your lab results and make any changes if needed Keep up the good work on healthy diet and regular exercise- you look great! We'll call you with your derm referral DECREASE the Sertraline to 25mg  daily Call with any questions or concerns Have a great summer!!  Congrats on the engagement!

## 2017-06-28 NOTE — Assessment & Plan Note (Signed)
Chronic problem, well controlled.  Asymptomatic.  Check labs.  No anticipated med changes.  Will follow. 

## 2017-06-28 NOTE — Progress Notes (Signed)
   Subjective:    Patient ID: Miguel Love, male    DOB: 1990-08-06, 27 y.o.   MRN: [id number]  HPI CPE- UTD on Tdap, flu.     Review of Systems Patient reports no vision/hearing changes, anorexia, fever ,adenopathy, persistant/recurrent hoarseness, swallowing issues, chest pain, palpitations, edema, persistant/recurrent cough, hemoptysis, dyspnea (rest,exertional, paroxysmal nocturnal), gastrointestinal  bleeding (melena, rectal bleeding), abdominal pain, excessive heart burn, GU symptoms (dysuria, hematuria, voiding/incontinence issues) syncope, focal weakness, memory loss, numbness & tingling, skin/nail changes, depression, anxiety, abnormal bruising/bleeding, musculoskeletal symptoms/signs.   + hair loss    Objective:   Physical Exam General Appearance:    Alert, cooperative, no distress, appears stated age  Head:    Normocephalic, without obvious abnormality, atraumatic  Eyes:    PERRL, conjunctiva/corneas clear, EOM's intact, fundi    benign, both eyes       Ears:    Normal TM's and external ear canals, both ears  Nose:   Nares normal, septum midline, mucosa normal, no drainage   or sinus tenderness  Throat:   Lips, mucosa, and tongue normal; teeth and gums normal  Neck:   Supple, symmetrical, trachea midline, no adenopathy;       thyroid:  No enlargement/tenderness/nodules  Back:     Symmetric, no curvature, ROM normal, no CVA tenderness  Lungs:     Clear to auscultation bilaterally, respirations unlabored  Chest wall:    No tenderness or deformity  Heart:    Regular rate and rhythm, S1 and S2 normal, no murmur, rub   or gallop  Abdomen:     Soft, non-tender, bowel sounds active all four quadrants,    no masses, no organomegaly  Genitalia:    Normal male without lesion, masses,discharge or tenderness  Rectal:    Deferred due to young age  Extremities:   Extremities normal, atraumatic, no cyanosis or edema  Pulses:   2+ and symmetric all extremities  Skin:   Skin color,  texture, turgor normal, no rashes or lesions  Lymph nodes:   Cervical, supraclavicular, and axillary nodes normal  Neurologic:   CNII-XII intact. Normal strength, sensation and reflexes      throughout          Assessment & Plan:

## 2017-06-29 ENCOUNTER — Encounter: Payer: Self-pay | Admitting: General Practice

## 2017-08-13 ENCOUNTER — Ambulatory Visit (INDEPENDENT_AMBULATORY_CARE_PROVIDER_SITE_OTHER): Payer: 59

## 2017-08-13 ENCOUNTER — Telehealth: Payer: Self-pay | Admitting: General Practice

## 2017-08-13 DIAGNOSIS — Z111 Encounter for screening for respiratory tuberculosis: Secondary | ICD-10-CM

## 2017-08-13 NOTE — Progress Notes (Signed)
Patient came in today for TB skin test given in left underside of forearm.  Patient tolerated well and was advised to come in 2-3 days at the same time to read.

## 2017-08-13 NOTE — Telephone Encounter (Signed)
Please advise, pt is on the schedule at 9:15 this morning to have a TB skin test placed. No approval from PCP previously . Ok to inject?

## 2017-08-13 NOTE — Telephone Encounter (Signed)
Ok for TB test  °

## 2017-08-15 ENCOUNTER — Encounter: Payer: Self-pay | Admitting: Emergency Medicine

## 2017-08-15 LAB — TB SKIN TEST
INDURATION: 0 mm
TB Skin Test: NEGATIVE

## 2017-09-27 MED FILL — SERTRALINE HCL 50 MG TABLET: 50 | 30 days supply | Qty: 30 | Fill #1

## 2017-09-27 MED FILL — METOPROLOL SUCCINATE ER 25: 25 | 30 days supply | Qty: 30 | Fill #1

## 2017-10-08 ENCOUNTER — Ambulatory Visit (INDEPENDENT_AMBULATORY_CARE_PROVIDER_SITE_OTHER): Payer: BLUE CROSS/BLUE SHIELD

## 2017-10-08 DIAGNOSIS — Z23 Encounter for immunization: Secondary | ICD-10-CM

## 2017-11-07 MED FILL — METOPROLOL SUCCINATE ER 25: 25 | 30 days supply | Qty: 30 | Fill #2

## 2017-11-07 MED FILL — SERTRALINE HCL 50 MG TABLET: 50 | 30 days supply | Qty: 30 | Fill #2

## 2017-12-19 MED FILL — METOPROLOL SUCCINATE ER 25: 25 | 30 days supply | Qty: 30 | Fill #3

## 2018-01-28 ENCOUNTER — Other Ambulatory Visit: Payer: Self-pay | Admitting: Family Medicine

## 2018-01-28 MED FILL — METOPROLOL SUCCINATE ER 25: 25 | 90 days supply | Qty: 90 | Fill #0

## 2018-01-31 DIAGNOSIS — H5213 Myopia, bilateral: Secondary | ICD-10-CM | POA: Diagnosis not present

## 2018-01-31 DIAGNOSIS — H52203 Unspecified astigmatism, bilateral: Secondary | ICD-10-CM | POA: Diagnosis not present

## 2018-03-11 MED FILL — SERTRALINE HCL 50 MG TABLET: 50 | 30 days supply | Qty: 30 | Fill #3

## 2018-05-22 MED FILL — SERTRALINE HCL 25 MG TABLET: 25 | 30 days supply | Qty: 30 | Fill #0

## 2018-05-22 MED FILL — METOPROLOL SUCCINATE ER 25: 25 | 90 days supply | Qty: 90 | Fill #1

## 2018-07-02 ENCOUNTER — Other Ambulatory Visit: Payer: Self-pay

## 2018-07-02 ENCOUNTER — Encounter: Payer: Self-pay | Admitting: Family Medicine

## 2018-07-02 ENCOUNTER — Ambulatory Visit (INDEPENDENT_AMBULATORY_CARE_PROVIDER_SITE_OTHER): Payer: 59 | Admitting: Family Medicine

## 2018-07-02 VITALS — BP 106/76 | HR 62 | Temp 98.0°F | Resp 16 | Ht 66.0 in | Wt 142.0 lb

## 2018-07-02 DIAGNOSIS — Z0000 Encounter for general adult medical examination without abnormal findings: Secondary | ICD-10-CM | POA: Diagnosis not present

## 2018-07-02 DIAGNOSIS — I10 Essential (primary) hypertension: Secondary | ICD-10-CM | POA: Diagnosis not present

## 2018-07-02 LAB — BASIC METABOLIC PANEL
BUN: 14 mg/dL (ref 6–23)
CO2: 26 mEq/L (ref 19–32)
Calcium: 9.7 mg/dL (ref 8.4–10.5)
Chloride: 100 mEq/L (ref 96–112)
Creatinine, Ser: 0.94 mg/dL (ref 0.40–1.50)
GFR: 94.19 mL/min (ref >60.00)
Glucose, Bld: 109 mg/dL — ABNORMAL HIGH (ref 70–99)
Potassium: 3.1 mEq/L — ABNORMAL LOW (ref 3.5–5.1)
Sodium: 138 mEq/L (ref 135–145)

## 2018-07-02 LAB — CBC WITH DIFFERENTIAL/PLATELET
Basophils Absolute: 0.1 10*3/uL (ref 0.0–0.1)
Basophils Relative: 0.9 % (ref 0.0–3.0)
Eosinophils Absolute: 0.1 10*3/uL (ref 0.0–0.7)
Eosinophils Relative: 1.9 % (ref 0.0–5.0)
HCT: 50.6 % (ref 39.0–52.0)
Hemoglobin: 17.6 g/dL — ABNORMAL HIGH (ref 13.0–17.0)
Lymphocytes Relative: 43.8 % (ref 12.0–46.0)
Lymphs Abs: 2.4 10*3/uL (ref 0.7–4.0)
MCHC: 34.7 g/dL (ref 30.0–36.0)
MCV: 88.6 fl (ref 78.0–100.0)
Monocytes Absolute: 0.4 10*3/uL (ref 0.1–1.0)
Monocytes Relative: 6.9 % (ref 3.0–12.0)
Neutro Abs: 2.6 10*3/uL (ref 1.4–7.7)
Neutrophils Relative %: 46.5 % (ref 43.0–77.0)
Platelets: 262 10*3/uL (ref 150.0–400.0)
RBC: 5.71 Mil/uL (ref 4.22–5.81)
RDW: 12.4 % (ref 11.5–15.5)
WBC: 5.6 10*3/uL (ref 4.0–10.5)

## 2018-07-02 LAB — HEPATIC FUNCTION PANEL
ALT: 69 U/L — ABNORMAL HIGH (ref 0–53)
AST: 38 U/L — ABNORMAL HIGH (ref 0–37)
Albumin: 5.1 g/dL (ref 3.5–5.2)
Alkaline Phosphatase: 61 U/L (ref 39–117)
Bilirubin, Direct: 0.2 mg/dL (ref 0.0–0.3)
Total Bilirubin: 0.8 mg/dL (ref 0.2–1.2)
Total Protein: 7.4 g/dL (ref 6.0–8.3)

## 2018-07-02 LAB — LIPID PANEL
Cholesterol: 174 mg/dL (ref 0–200)
HDL: 61.6 mg/dL (ref >39.00)
LDL Cholesterol: 98 mg/dL (ref 0–99)
NonHDL: 112.4
Total CHOL/HDL Ratio: 3
Triglycerides: 73 mg/dL (ref 0.0–149.0)
VLDL: 14.6 mg/dL (ref 0.0–40.0)

## 2018-07-02 LAB — TSH: TSH: 3.48 u[IU]/mL (ref 0.35–4.50)

## 2018-07-02 NOTE — Assessment & Plan Note (Signed)
Chronic problem, well controlled.  Asymptomatic.  Check labs.  No anticipated med changes.  Will follow. 

## 2018-07-02 NOTE — Progress Notes (Signed)
   Subjective:    Patient ID: Miguel Love, male    DOB: 1990/05/09, 28 y.o.   MRN: [id number]  HPI CPE- UTD immunizations.   Review of Systems Patient reports no vision/hearing changes, anorexia, fever ,adenopathy, persistant/recurrent hoarseness, swallowing issues, chest pain, palpitations, edema, persistant/recurrent cough, hemoptysis, dyspnea (rest,exertional, paroxysmal nocturnal), gastrointestinal  bleeding (melena, rectal bleeding), abdominal pain, excessive heart burn, GU symptoms (dysuria, hematuria, voiding/incontinence issues) syncope, focal weakness, memory loss, numbness & tingling, skin/hair/nail changes, depression, anxiety, abnormal bruising/bleeding, musculoskeletal symptoms/signs.     Objective:   Physical Exam General Appearance:    Alert, cooperative, no distress, appears stated age  Head:    Normocephalic, without obvious abnormality, atraumatic  Eyes:    PERRL, conjunctiva/corneas clear, EOM's intact, fundi    benign, both eyes       Ears:    Normal TM's and external ear canals, both ears  Nose:   Nares normal, septum midline, mucosa normal, no drainage   or sinus tenderness  Throat:   Lips, mucosa, and tongue normal; teeth and gums normal  Neck:   Supple, symmetrical, trachea midline, no adenopathy;       thyroid:  No enlargement/tenderness/nodules  Back:     Symmetric, no curvature, ROM normal, no CVA tenderness  Lungs:     Clear to auscultation bilaterally, respirations unlabored  Chest wall:    No tenderness or deformity  Heart:    Regular rate and rhythm, S1 and S2 normal, no murmur, rub   or gallop  Abdomen:     Soft, non-tender, bowel sounds active all four quadrants,    no masses, no organomegaly  Genitalia:    Normal male without lesion, masses,discharge or tenderness  Rectal:    Deferred due to young age  Extremities:   Extremities normal, atraumatic, no cyanosis or edema  Pulses:   2+ and symmetric all extremities  Skin:   Skin color, texture,  turgor normal, no rashes or lesions  Lymph nodes:   Cervical, supraclavicular, and axillary nodes normal  Neurologic:   CNII-XII intact. Normal strength, sensation and reflexes      throughout          Assessment & Plan:

## 2018-07-02 NOTE — Assessment & Plan Note (Signed)
Pt's PE WNL.  UTD on immunizations.  Check labs.  Anticipatory guidance provided.  

## 2018-07-02 NOTE — Patient Instructions (Signed)
Follow up in 1 year or as needed We'll notify you of your lab results and make any changes if needed Keep up the good work!!  You look great!! Call with any questions or concerns Stay Safe!!!

## 2018-07-03 ENCOUNTER — Other Ambulatory Visit: Payer: Self-pay | Admitting: Family Medicine

## 2018-07-03 DIAGNOSIS — R748 Abnormal levels of other serum enzymes: Secondary | ICD-10-CM

## 2018-07-03 DIAGNOSIS — E876 Hypokalemia: Secondary | ICD-10-CM

## 2018-07-03 MED ORDER — POTASSIUM CHLORIDE CRYS ER 20 MEQ PO TBCR: 20.0000 meq | EXTENDED_RELEASE_TABLET | Freq: Every day | ORAL | 3 refills | Status: DC

## 2018-07-03 MED FILL — POTASSIUM CHLORIDE CRYS ER: 20 | 30 days supply | Qty: 30 | Fill #0

## 2018-07-04 ENCOUNTER — Encounter: Payer: Self-pay | Admitting: Family Medicine

## 2018-07-04 ENCOUNTER — Other Ambulatory Visit: Payer: Self-pay | Admitting: Family Medicine

## 2018-07-04 MED FILL — SERTRALINE HCL 25 MG TABLET: 25 | 90 days supply | Qty: 90 | Fill #0

## 2018-07-19 ENCOUNTER — Other Ambulatory Visit (INDEPENDENT_AMBULATORY_CARE_PROVIDER_SITE_OTHER): Payer: 59

## 2018-07-19 DIAGNOSIS — E876 Hypokalemia: Secondary | ICD-10-CM | POA: Diagnosis not present

## 2018-07-19 DIAGNOSIS — R748 Abnormal levels of other serum enzymes: Secondary | ICD-10-CM

## 2018-07-19 LAB — BASIC METABOLIC PANEL
BUN: 13 mg/dL (ref 6–23)
CO2: 25 mEq/L (ref 19–32)
Calcium: 9.2 mg/dL (ref 8.4–10.5)
Chloride: 105 mEq/L (ref 96–112)
Creatinine, Ser: 0.86 mg/dL (ref 0.40–1.50)
GFR: 104.34 mL/min (ref >60.00)
Glucose, Bld: 69 mg/dL — ABNORMAL LOW (ref 70–99)
Potassium: 3.8 mEq/L (ref 3.5–5.1)
Sodium: 139 mEq/L (ref 135–145)

## 2018-07-19 LAB — HEPATIC FUNCTION PANEL
ALT: 54 U/L — ABNORMAL HIGH (ref 0–53)
AST: 24 U/L (ref 0–37)
Albumin: 4.7 g/dL (ref 3.5–5.2)
Alkaline Phosphatase: 54 U/L (ref 39–117)
Bilirubin, Direct: 0.1 mg/dL (ref 0.0–0.3)
Total Bilirubin: 0.8 mg/dL (ref 0.2–1.2)
Total Protein: 7 g/dL (ref 6.0–8.3)

## 2018-08-08 DIAGNOSIS — Z1283 Encounter for screening for malignant neoplasm of skin: Secondary | ICD-10-CM | POA: Diagnosis not present

## 2018-08-08 DIAGNOSIS — L649 Androgenic alopecia, unspecified: Secondary | ICD-10-CM | POA: Diagnosis not present

## 2018-08-08 DIAGNOSIS — D235 Other benign neoplasm of skin of trunk: Secondary | ICD-10-CM | POA: Diagnosis not present

## 2018-08-08 DIAGNOSIS — L814 Other melanin hyperpigmentation: Secondary | ICD-10-CM | POA: Diagnosis not present

## 2018-08-08 DIAGNOSIS — D2260 Melanocytic nevi of unspecified upper limb, including shoulder: Secondary | ICD-10-CM | POA: Diagnosis not present

## 2018-08-08 DIAGNOSIS — Z808 Family history of malignant neoplasm of other organs or systems: Secondary | ICD-10-CM | POA: Diagnosis not present

## 2018-08-08 DIAGNOSIS — D225 Melanocytic nevi of trunk: Secondary | ICD-10-CM | POA: Diagnosis not present

## 2018-08-08 DIAGNOSIS — D2230 Melanocytic nevi of unspecified part of face: Secondary | ICD-10-CM | POA: Diagnosis not present

## 2018-09-23 ENCOUNTER — Other Ambulatory Visit: Payer: Self-pay | Admitting: Family Medicine

## 2018-09-23 ENCOUNTER — Encounter: Payer: Self-pay | Admitting: Family Medicine

## 2018-09-23 MED FILL — METOPROLOL SUCCINATE ER 25: 25 | 90 days supply | Qty: 90 | Fill #0

## 2018-10-16 ENCOUNTER — Ambulatory Visit: Payer: 59 | Admitting: Family Medicine

## 2018-10-16 ENCOUNTER — Other Ambulatory Visit: Payer: Self-pay

## 2018-10-16 ENCOUNTER — Encounter: Payer: Self-pay | Admitting: Family Medicine

## 2018-10-16 VITALS — BP 123/96 | HR 78 | Temp 97.9°F | Resp 16 | Ht 66.0 in | Wt 140.0 lb

## 2018-10-16 DIAGNOSIS — R140 Abdominal distension (gaseous): Secondary | ICD-10-CM | POA: Diagnosis not present

## 2018-10-16 DIAGNOSIS — K2900 Acute gastritis without bleeding: Secondary | ICD-10-CM | POA: Diagnosis not present

## 2018-10-16 MED ORDER — PANTOPRAZOLE SODIUM 40 MG PO TBEC: 40.0000 mg | DELAYED_RELEASE_TABLET | Freq: Every day | ORAL | 1 refills | Status: DC

## 2018-10-16 MED ORDER — SUCRALFATE 1 G PO TABS: 1.0000 g | ORAL_TABLET | Freq: Three times a day (TID) | ORAL | 0 refills | Status: DC

## 2018-10-16 MED FILL — PANTOPRAZOLE SOD DR 40 MG T: 40 | 60 days supply | Qty: 60 | Fill #0

## 2018-10-16 MED FILL — SUCRALFATE 1 GM TABLET: 1 | 30 days supply | Qty: 120 | Fill #0

## 2018-10-16 NOTE — Progress Notes (Signed)
   Subjective:    Patient ID: Miguel Love, male    DOB: 1990/01/13, 28 y.o.   MRN: [id number]  HPI Gas and bloating- pt reports burping after eating starting ~2 weeks ago.  Tried OTC Pepcid w/o relief.  Added Tums w/ some improvement.  Tried Mylanta which has provided the most relief- using BID.  Pt has hx of IBS and is attempting to change diet.  No change in appetite.  Some esophageal 'soreness'.  sxs improved w/ eating.  Denies changes to BMs.  Increased stress recently.  Increased caffeine recently (soda).  No alcohol.   Review of Systems For ROS see HPI     Objective:   Physical Exam Vitals signs reviewed.  Constitutional:      General: He is not in acute distress.    Appearance: Normal appearance. He is normal weight. He is not ill-appearing.  HENT:     Head: Normocephalic and atraumatic.  Cardiovascular:     Rate and Rhythm: Normal rate and regular rhythm.     Pulses: Normal pulses.  Pulmonary:     Effort: Pulmonary effort is normal.     Breath sounds: Normal breath sounds.  Abdominal:     General: Abdomen is flat. Bowel sounds are normal. There is distension (mild).     Palpations: Abdomen is soft.     Tenderness: There is no abdominal tenderness. There is no guarding.  Neurological:     General: No focal deficit present.     Mental Status: He is alert and oriented to person, place, and time.  Psychiatric:        Mood and Affect: Mood normal.        Behavior: Behavior normal.           Assessment & Plan:  Gas/bloating/gastritis- new.  Given pt's stress level suspect increased acid production w/ gastritis/esophagitis since Mylanta and food improve sxs.  Start daily PPI.  Add Carafate.  Discussed lifestyle and dietary changes to improve sxs.  If no improvement will need to check H pylori or possibly refer to GI.  Pt expressed understanding and is in agreement w/ plan.

## 2018-10-16 NOTE — Patient Instructions (Signed)
Follow up as needed or as scheduled START the Pantoprazole once daily ADD the Carafate prior to meals and before bed Continue to limit alcohol, caffeine, and spicy foods Graze throughout the day to avoid the stomach being empty Call with any questions or concerns Stay Safe!  Hang in there!!!

## 2018-11-07 MED FILL — SERTRALINE HCL 25 MG TABLET: 25 | 90 days supply | Qty: 90 | Fill #1

## 2018-11-13 ENCOUNTER — Encounter: Payer: Self-pay | Admitting: Family Medicine

## 2018-11-13 MED ORDER — ALBUTEROL SULFATE HFA 108 (90 BASE) MCG/ACT IN AERS: 2.0000 | INHALATION_SPRAY | Freq: Four times a day (QID) | RESPIRATORY_TRACT | 1 refills | Status: DC | PRN

## 2018-11-13 MED FILL — ALBUTEROL SULFATE HFA 108 (: 108 (90 BAS | 25 days supply | Qty: 9 | Fill #0

## 2018-11-18 ENCOUNTER — Encounter: Payer: Self-pay | Admitting: Physician Assistant

## 2018-11-18 ENCOUNTER — Other Ambulatory Visit: Payer: Self-pay | Admitting: Family Medicine

## 2018-11-18 ENCOUNTER — Other Ambulatory Visit: Payer: Self-pay

## 2018-11-18 ENCOUNTER — Ambulatory Visit (INDEPENDENT_AMBULATORY_CARE_PROVIDER_SITE_OTHER): Payer: 59 | Admitting: Physician Assistant

## 2018-11-18 VITALS — HR 90 | Temp 99.2°F

## 2018-11-18 DIAGNOSIS — U071 COVID-19: Secondary | ICD-10-CM

## 2018-11-18 DIAGNOSIS — J302 Other seasonal allergic rhinitis: Secondary | ICD-10-CM

## 2018-11-18 MED FILL — FLUTICASONE PROP 50 MCG SPR: 50 | 30 days supply | Qty: 16 | Fill #0

## 2018-11-18 NOTE — Patient Instructions (Signed)
Instructions sent to MyChart.   Please keep well-hydrated and get plenty of rest.  Tylenol if needed for fever. Put a humidifier in the bedroom and run at night.  Ok to continue Vitamin C and Flonase.  If you note any shortness of breath or significant worsening of symptoms, please let us know.  Please complete the daily COVID monitoring questions so we can keep a close eye on you.   We need you to remain quarantined until 10 days from symptom onset, no fever for 72 hours without medication and you are feeling better.   Hang in there!

## 2018-11-18 NOTE — Progress Notes (Signed)
I have discussed the procedure for the virtual visit with the patient who has given consent to proceed with assessment and treatment.   Keanu Lesniak S Eryck Negron, CMA     

## 2018-11-18 NOTE — Progress Notes (Signed)
Virtual Visit via Video   I connected with patient on 11/18/18 at  4:00 PM EST by a video enabled telemedicine application and verified that I am speaking with the correct person using two identifiers.  Location patient: Home Location provider: [facility] Persons participating in the virtual visit: Patient, Provider, PA-Student Drucilla Schmidt), CMA (Eduard Clos)  I discussed the limitations of evaluation and management by telemedicine and the availability of in person appointments. The patient expressed understanding and agreed to proceed.  Subjective:   HPI:   Patient presents via [facility] for c/o COVID with fever. Dad and sister tested positive. Patient tested positive for COVID on 11/13/18. Notes symptoms starting 11/11/2018. Felt worse yesterday, temperature 100.60F after taking medication. Took Theraflu, states he felty "jittery", anxious and increased heart rate to 120-130s. Has been taking Tylenol prn, last dose of Tylenol was 9:20pm yesterday. Has been staying hydrated, drinking water, Gatorade and orange juice. Notes occasional headaches, diminished smell and taste, decreased appetite, loose stools. Denies frequent cough, nausea, vomiting, shortness of breath.   ROS:  See pertinent positives and negatives per HPI.  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/2011   Elevated urine levels of catecholamines 05/15/2011   Anxiety 05/15/2011   Weight loss 05/15/2011    Social History   Tobacco Use   Smoking status: Never Smoker   Smokeless tobacco: Never Used  Substance Use Topics   Alcohol use: Yes    Comment: occassional    Current Outpatient Medications:    albuterol (VENTOLIN HFA) 108 (90 Base) MCG/ACT inhaler, Inhale 2 puffs into the lungs every 6 (six) hours as needed for wheezing or  shortness of breath., Disp: 18 g, Rfl: 1   fluticasone (FLONASE) 50 MCG/ACT nasal spray, PLACE 2 SPRAYS INTO BOTH NOSTRILS DAILY., Disp: 16 g, Rfl: 3   loratadine (CLARITIN) 10 MG tablet, Take 1 tablet (10 mg total) by mouth daily., Disp: 30 tablet, Rfl: 0   metoprolol succinate (TOPROL-XL) 25 MG 24 hr tablet, TAKE 1 TABLET (25 MG TOTAL) BY MOUTH DAILY., Disp: 90 tablet, Rfl: 1   pantoprazole (PROTONIX) 40 MG tablet, Take 1 tablet (40 mg total) by mouth daily., Disp: 30 tablet, Rfl: 1   potassium chloride SA (K-DUR) 20 MEQ tablet, Take 1 tablet (20 mEq total) by mouth daily., Disp: 30 tablet, Rfl: 3   sertraline (ZOLOFT) 25 MG tablet, TAKE 1 TABLET (25 MG TOTAL) BY MOUTH DAILY., Disp: 90 tablet, Rfl: 1   sucralfate (CARAFATE) 1 g tablet, Take 1 tablet (1 g total) by mouth 4 (four) times daily -  with meals and at bedtime., Disp: 120 tablet, Rfl: 0  Allergies  Allergen Reactions   Nsaids Shortness Of Breath    Asthma attack    Objective:   Pulse 90    Temp 99.2 F (37.3 C) (Temporal)    SpO2 98%   Patient is well-developed, well-nourished in no acute distress.  Resting comfortably at home.  Head is normocephalic, atraumatic.  No labored breathing.  Speech is clear and coherent with logical content.  Patient is alert and oriented at baseline.   Assessment and Plan:   1. COVID-19 virus infection Mild symptoms. Improving. Afebrile since last night. Enrolled patient in [place] monitoring program. Ok to take Tylenol but only if needed. Do not recommend scheduled tylenol just to keep in system due  to history of mild LFT elevation. Supportive measures and OTC medications reviewed. Strict follow-up reviewed with patient. Handout sent to MyChart.  - MyChart COVID-19 home monitoring program; Future - Temperature monitoring; Future    Leeanne Rio, PA-C 11/18/2018

## 2018-12-11 DIAGNOSIS — Z808 Family history of malignant neoplasm of other organs or systems: Secondary | ICD-10-CM | POA: Diagnosis not present

## 2018-12-11 DIAGNOSIS — D2271 Melanocytic nevi of right lower limb, including hip: Secondary | ICD-10-CM | POA: Diagnosis not present

## 2018-12-11 DIAGNOSIS — D2262 Melanocytic nevi of left upper limb, including shoulder: Secondary | ICD-10-CM | POA: Diagnosis not present

## 2018-12-11 DIAGNOSIS — L649 Androgenic alopecia, unspecified: Secondary | ICD-10-CM | POA: Diagnosis not present

## 2018-12-11 DIAGNOSIS — D2261 Melanocytic nevi of right upper limb, including shoulder: Secondary | ICD-10-CM | POA: Diagnosis not present

## 2018-12-11 DIAGNOSIS — L814 Other melanin hyperpigmentation: Secondary | ICD-10-CM | POA: Diagnosis not present

## 2019-01-14 MED FILL — METOPROLOL SUCCINATE ER 25: 25 | 90 days supply | Qty: 90 | Fill #1

## 2019-01-28 ENCOUNTER — Ambulatory Visit (INDEPENDENT_AMBULATORY_CARE_PROVIDER_SITE_OTHER): Payer: 59 | Admitting: Physician Assistant

## 2019-01-28 ENCOUNTER — Encounter: Payer: Self-pay | Admitting: Physician Assistant

## 2019-01-28 ENCOUNTER — Other Ambulatory Visit: Payer: Self-pay

## 2019-01-28 VITALS — HR 88

## 2019-01-28 DIAGNOSIS — M5431 Sciatica, right side: Secondary | ICD-10-CM | POA: Diagnosis not present

## 2019-01-28 DIAGNOSIS — M5432 Sciatica, left side: Secondary | ICD-10-CM | POA: Diagnosis not present

## 2019-01-28 DIAGNOSIS — F419 Anxiety disorder, unspecified: Secondary | ICD-10-CM | POA: Diagnosis not present

## 2019-01-28 MED ORDER — SERTRALINE HCL 50 MG PO TABS: 50.0000 mg | ORAL_TABLET | Freq: Every day | ORAL | 3 refills | Status: DC

## 2019-01-28 MED ORDER — LORAZEPAM 0.5 MG PO TABS: 0.2500 mg | ORAL_TABLET | Freq: Three times a day (TID) | ORAL | 0 refills | Status: DC | PRN

## 2019-01-28 MED ORDER — METHYLPREDNISOLONE 4 MG PO TBPK: ORAL_TABLET | ORAL | 0 refills | Status: DC

## 2019-01-28 MED FILL — LORazepam 0.5 MG TABS: 0.5 | 2 days supply | Qty: 5 | Fill #0

## 2019-01-28 MED FILL — METHYLPREDNISOLONE 4 MG TBP: 4 | 6 days supply | Qty: 21 | Fill #0

## 2019-01-28 NOTE — Progress Notes (Signed)
I have discussed the procedure for the virtual visit with the patient who has given consent to proceed with assessment and treatment.   Jakylan Ron N Chaley Castellanos, CMA      

## 2019-01-28 NOTE — Progress Notes (Signed)
Virtual Visit via Video   I connected with patient on 01/28/19 at  3:30 PM EST by a video enabled telemedicine application and verified that I am speaking with the correct person using two identifiers.  Location patient: Home Location provider: [facility] Persons participating in the virtual visit: Patient, Provider, [place]islaus Denita Lung)  I discussed the limitations of evaluation and management by telemedicine and the availability of in person appointments. The patient expressed understanding and agreed to proceed.  Subjective:   HPI:   Patient presents via doxy doxy.me today with multiple complaints.  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  Patient currently on a regimen of Zoloft 25 mg daily.  Endorses taking as directed.  Has previously been well controlled on this regimen but now having breakthrough anxiety and some depressed mood.  Denies suicidal thought or ideation.  Occasionally will have acute anxiety culminating in panic attack.  Patient also endorses a couple weeks of noting numbness and tingling of big toes bilaterally.  States initially was in the left toe only and thought was related to some new shoes he bought.  Stopped wearing these but symptoms have persisted.  Initially was having low back pain associated with the symptoms though this is calm down a bit.  Denies radicular pain.  Notes numbness and tingling are of great toes only.  Patient without history of diabetes, known anemia, thyroid disorder or family history of known idiopathic polyneuropathy.  Patient denies any weakness in extremities.  Denies change to bowel or bladder habits.  ROS:   See pertinent positives and negatives per HPI.  Patient Active Problem List   Diagnosis Date Noted  . IBS (irritable bowel syndrome) 07/20/2014  . HTN (hypertension)  10/04/2012  . General medical examination 06/29/2011  . Childhood asthma 05/15/2011  . Elevated urine levels of catecholamines 05/15/2011  . Anxiety 05/15/2011  . Weight loss 05/15/2011    Social History   Tobacco Use  . Smoking status: Never Smoker  . Smokeless tobacco: Never Used  Substance Use Topics  . Alcohol use: Yes    Comment: occassional    Current Outpatient Medications:  .  fluticasone (FLONASE) 50 MCG/ACT nasal spray, PLACE 2 SPRAYS INTO BOTH NOSTRILS DAILY., Disp: 16 g, Rfl: 3 .  metoprolol succinate (TOPROL-XL) 25 MG 24 hr tablet, TAKE 1 TABLET (25 MG TOTAL) BY MOUTH DAILY., Disp: 90 tablet, Rfl: 1 .  sertraline (ZOLOFT) 25 MG tablet, TAKE 1 TABLET (25 MG TOTAL) BY MOUTH DAILY., Disp: 90 tablet, Rfl: 1 .  zinc gluconate 50 MG tablet, Take 50 mg by mouth daily., Disp: , Rfl:   Allergies  Allergen Reactions  . Nsaids Shortness Of Breath    Asthma attack    Objective:   Pulse 88   SpO2 97%   Patient is well-developed, well-nourished in no acute distress.  Resting comfortably at home.  Head is normocephalic, atraumatic.  No labored breathing.  Speech is clear and coherent with logical content.  Patient is alert and oriented at baseline.   Assessment and Plan:   1. Anxiety Increase due to stressors revolving around the pandemic.  Will increase Zoloft to 50 mg once daily.  Rx Ativan 0.5 mg to use as directed as needed for panic attack.  Quantity 5 of this given.  Follow-up with PCP in 3 to 4 weeks.  Patient voiced  understanding and agreement with plan. - LORazepam (ATIVAN) 0.5 MG tablet; Take 0.5-1 tablets (0.25-0.5 mg total) by mouth every 8 (eight) hours as needed for anxiety.  Dispense: 5 tablet; Refill: 0 - sertraline (ZOLOFT) 50 MG tablet; Take 1 tablet (50 mg total) by mouth daily.  Dispense: 30 tablet; Refill: 3  2. Bilateral sciatica Suspect giving lower back pain with current symptoms that there is some nerve irritation in lumbar spine.  Patient  allergic to NSAIDs.  Will Rx Medrol Dosepak.  Supportive measures reviewed.  If symptoms or not improving/resolving we will get him in for lab assessment to check glucose level, thyroid and B12 level.  We will also need to consider imaging. - methylPREDNISolone (MEDROL) 4 MG TBPK tablet; Take following package directions.  Dispense: 21 tablet; Refill: 0    Leeanne Rio, [place] 01/28/2019

## 2019-02-03 DIAGNOSIS — M9901 Segmental and somatic dysfunction of cervical region: Secondary | ICD-10-CM | POA: Diagnosis not present

## 2019-02-03 DIAGNOSIS — M9902 Segmental and somatic dysfunction of thoracic region: Secondary | ICD-10-CM | POA: Diagnosis not present

## 2019-02-03 DIAGNOSIS — M5442 Lumbago with sciatica, left side: Secondary | ICD-10-CM | POA: Diagnosis not present

## 2019-02-03 DIAGNOSIS — M9904 Segmental and somatic dysfunction of sacral region: Secondary | ICD-10-CM | POA: Diagnosis not present

## 2019-02-03 DIAGNOSIS — M40202 Unspecified kyphosis, cervical region: Secondary | ICD-10-CM | POA: Diagnosis not present

## 2019-02-03 DIAGNOSIS — M5137 Other intervertebral disc degeneration, lumbosacral region: Secondary | ICD-10-CM | POA: Diagnosis not present

## 2019-02-03 DIAGNOSIS — M5441 Lumbago with sciatica, right side: Secondary | ICD-10-CM | POA: Diagnosis not present

## 2019-02-03 DIAGNOSIS — M4126 Other idiopathic scoliosis, lumbar region: Secondary | ICD-10-CM | POA: Diagnosis not present

## 2019-02-03 DIAGNOSIS — M9903 Segmental and somatic dysfunction of lumbar region: Secondary | ICD-10-CM | POA: Diagnosis not present

## 2019-02-04 DIAGNOSIS — M5441 Lumbago with sciatica, right side: Secondary | ICD-10-CM | POA: Diagnosis not present

## 2019-02-04 DIAGNOSIS — M9904 Segmental and somatic dysfunction of sacral region: Secondary | ICD-10-CM | POA: Diagnosis not present

## 2019-02-04 DIAGNOSIS — M4126 Other idiopathic scoliosis, lumbar region: Secondary | ICD-10-CM | POA: Diagnosis not present

## 2019-02-04 DIAGNOSIS — M9903 Segmental and somatic dysfunction of lumbar region: Secondary | ICD-10-CM | POA: Diagnosis not present

## 2019-02-04 DIAGNOSIS — M9901 Segmental and somatic dysfunction of cervical region: Secondary | ICD-10-CM | POA: Diagnosis not present

## 2019-02-04 DIAGNOSIS — M40202 Unspecified kyphosis, cervical region: Secondary | ICD-10-CM | POA: Diagnosis not present

## 2019-02-04 DIAGNOSIS — M9902 Segmental and somatic dysfunction of thoracic region: Secondary | ICD-10-CM | POA: Diagnosis not present

## 2019-02-04 DIAGNOSIS — M5137 Other intervertebral disc degeneration, lumbosacral region: Secondary | ICD-10-CM | POA: Diagnosis not present

## 2019-02-04 DIAGNOSIS — M5442 Lumbago with sciatica, left side: Secondary | ICD-10-CM | POA: Diagnosis not present

## 2019-02-05 DIAGNOSIS — M4126 Other idiopathic scoliosis, lumbar region: Secondary | ICD-10-CM | POA: Diagnosis not present

## 2019-02-05 DIAGNOSIS — M9901 Segmental and somatic dysfunction of cervical region: Secondary | ICD-10-CM | POA: Diagnosis not present

## 2019-02-05 DIAGNOSIS — M9904 Segmental and somatic dysfunction of sacral region: Secondary | ICD-10-CM | POA: Diagnosis not present

## 2019-02-05 DIAGNOSIS — M5137 Other intervertebral disc degeneration, lumbosacral region: Secondary | ICD-10-CM | POA: Diagnosis not present

## 2019-02-05 DIAGNOSIS — M5442 Lumbago with sciatica, left side: Secondary | ICD-10-CM | POA: Diagnosis not present

## 2019-02-05 DIAGNOSIS — M9902 Segmental and somatic dysfunction of thoracic region: Secondary | ICD-10-CM | POA: Diagnosis not present

## 2019-02-05 DIAGNOSIS — M9903 Segmental and somatic dysfunction of lumbar region: Secondary | ICD-10-CM | POA: Diagnosis not present

## 2019-02-05 DIAGNOSIS — M40202 Unspecified kyphosis, cervical region: Secondary | ICD-10-CM | POA: Diagnosis not present

## 2019-02-05 DIAGNOSIS — M5441 Lumbago with sciatica, right side: Secondary | ICD-10-CM | POA: Diagnosis not present

## 2019-02-05 MED FILL — SERTRALINE HCL 50 MG TABLET: 50 | 30 days supply | Qty: 30 | Fill #0

## 2019-02-10 DIAGNOSIS — M5137 Other intervertebral disc degeneration, lumbosacral region: Secondary | ICD-10-CM | POA: Diagnosis not present

## 2019-02-10 DIAGNOSIS — M9902 Segmental and somatic dysfunction of thoracic region: Secondary | ICD-10-CM | POA: Diagnosis not present

## 2019-02-10 DIAGNOSIS — M9903 Segmental and somatic dysfunction of lumbar region: Secondary | ICD-10-CM | POA: Diagnosis not present

## 2019-02-10 DIAGNOSIS — M5441 Lumbago with sciatica, right side: Secondary | ICD-10-CM | POA: Diagnosis not present

## 2019-02-10 DIAGNOSIS — M4126 Other idiopathic scoliosis, lumbar region: Secondary | ICD-10-CM | POA: Diagnosis not present

## 2019-02-10 DIAGNOSIS — M9901 Segmental and somatic dysfunction of cervical region: Secondary | ICD-10-CM | POA: Diagnosis not present

## 2019-02-10 DIAGNOSIS — M5442 Lumbago with sciatica, left side: Secondary | ICD-10-CM | POA: Diagnosis not present

## 2019-02-10 DIAGNOSIS — M9904 Segmental and somatic dysfunction of sacral region: Secondary | ICD-10-CM | POA: Diagnosis not present

## 2019-02-10 DIAGNOSIS — M40202 Unspecified kyphosis, cervical region: Secondary | ICD-10-CM | POA: Diagnosis not present

## 2019-02-11 DIAGNOSIS — M5441 Lumbago with sciatica, right side: Secondary | ICD-10-CM | POA: Diagnosis not present

## 2019-02-11 DIAGNOSIS — M9902 Segmental and somatic dysfunction of thoracic region: Secondary | ICD-10-CM | POA: Diagnosis not present

## 2019-02-11 DIAGNOSIS — M5442 Lumbago with sciatica, left side: Secondary | ICD-10-CM | POA: Diagnosis not present

## 2019-02-11 DIAGNOSIS — M4126 Other idiopathic scoliosis, lumbar region: Secondary | ICD-10-CM | POA: Diagnosis not present

## 2019-02-11 DIAGNOSIS — M9901 Segmental and somatic dysfunction of cervical region: Secondary | ICD-10-CM | POA: Diagnosis not present

## 2019-02-11 DIAGNOSIS — M40202 Unspecified kyphosis, cervical region: Secondary | ICD-10-CM | POA: Diagnosis not present

## 2019-02-11 DIAGNOSIS — M9903 Segmental and somatic dysfunction of lumbar region: Secondary | ICD-10-CM | POA: Diagnosis not present

## 2019-02-11 DIAGNOSIS — M9904 Segmental and somatic dysfunction of sacral region: Secondary | ICD-10-CM | POA: Diagnosis not present

## 2019-02-11 DIAGNOSIS — M5137 Other intervertebral disc degeneration, lumbosacral region: Secondary | ICD-10-CM | POA: Diagnosis not present

## 2019-02-12 DIAGNOSIS — M5137 Other intervertebral disc degeneration, lumbosacral region: Secondary | ICD-10-CM | POA: Diagnosis not present

## 2019-02-12 DIAGNOSIS — M9903 Segmental and somatic dysfunction of lumbar region: Secondary | ICD-10-CM | POA: Diagnosis not present

## 2019-02-12 DIAGNOSIS — M4126 Other idiopathic scoliosis, lumbar region: Secondary | ICD-10-CM | POA: Diagnosis not present

## 2019-02-12 DIAGNOSIS — M9901 Segmental and somatic dysfunction of cervical region: Secondary | ICD-10-CM | POA: Diagnosis not present

## 2019-02-12 DIAGNOSIS — M9904 Segmental and somatic dysfunction of sacral region: Secondary | ICD-10-CM | POA: Diagnosis not present

## 2019-02-12 DIAGNOSIS — M40202 Unspecified kyphosis, cervical region: Secondary | ICD-10-CM | POA: Diagnosis not present

## 2019-02-12 DIAGNOSIS — M9902 Segmental and somatic dysfunction of thoracic region: Secondary | ICD-10-CM | POA: Diagnosis not present

## 2019-02-12 DIAGNOSIS — M5442 Lumbago with sciatica, left side: Secondary | ICD-10-CM | POA: Diagnosis not present

## 2019-02-12 DIAGNOSIS — M5441 Lumbago with sciatica, right side: Secondary | ICD-10-CM | POA: Diagnosis not present

## 2019-02-17 DIAGNOSIS — M5441 Lumbago with sciatica, right side: Secondary | ICD-10-CM | POA: Diagnosis not present

## 2019-02-17 DIAGNOSIS — M4126 Other idiopathic scoliosis, lumbar region: Secondary | ICD-10-CM | POA: Diagnosis not present

## 2019-02-17 DIAGNOSIS — M9902 Segmental and somatic dysfunction of thoracic region: Secondary | ICD-10-CM | POA: Diagnosis not present

## 2019-02-17 DIAGNOSIS — M9904 Segmental and somatic dysfunction of sacral region: Secondary | ICD-10-CM | POA: Diagnosis not present

## 2019-02-17 DIAGNOSIS — M5137 Other intervertebral disc degeneration, lumbosacral region: Secondary | ICD-10-CM | POA: Diagnosis not present

## 2019-02-17 DIAGNOSIS — M5442 Lumbago with sciatica, left side: Secondary | ICD-10-CM | POA: Diagnosis not present

## 2019-02-17 DIAGNOSIS — M9903 Segmental and somatic dysfunction of lumbar region: Secondary | ICD-10-CM | POA: Diagnosis not present

## 2019-02-17 DIAGNOSIS — M9901 Segmental and somatic dysfunction of cervical region: Secondary | ICD-10-CM | POA: Diagnosis not present

## 2019-02-17 DIAGNOSIS — M40202 Unspecified kyphosis, cervical region: Secondary | ICD-10-CM | POA: Diagnosis not present

## 2019-02-18 DIAGNOSIS — M9901 Segmental and somatic dysfunction of cervical region: Secondary | ICD-10-CM | POA: Diagnosis not present

## 2019-02-18 DIAGNOSIS — M5137 Other intervertebral disc degeneration, lumbosacral region: Secondary | ICD-10-CM | POA: Diagnosis not present

## 2019-02-18 DIAGNOSIS — M9903 Segmental and somatic dysfunction of lumbar region: Secondary | ICD-10-CM | POA: Diagnosis not present

## 2019-02-18 DIAGNOSIS — M9902 Segmental and somatic dysfunction of thoracic region: Secondary | ICD-10-CM | POA: Diagnosis not present

## 2019-02-18 DIAGNOSIS — M4126 Other idiopathic scoliosis, lumbar region: Secondary | ICD-10-CM | POA: Diagnosis not present

## 2019-02-18 DIAGNOSIS — M9904 Segmental and somatic dysfunction of sacral region: Secondary | ICD-10-CM | POA: Diagnosis not present

## 2019-02-18 DIAGNOSIS — M5441 Lumbago with sciatica, right side: Secondary | ICD-10-CM | POA: Diagnosis not present

## 2019-02-18 DIAGNOSIS — M5442 Lumbago with sciatica, left side: Secondary | ICD-10-CM | POA: Diagnosis not present

## 2019-02-18 DIAGNOSIS — M40202 Unspecified kyphosis, cervical region: Secondary | ICD-10-CM | POA: Diagnosis not present

## 2019-02-19 DIAGNOSIS — M9904 Segmental and somatic dysfunction of sacral region: Secondary | ICD-10-CM | POA: Diagnosis not present

## 2019-02-19 DIAGNOSIS — M9902 Segmental and somatic dysfunction of thoracic region: Secondary | ICD-10-CM | POA: Diagnosis not present

## 2019-02-19 DIAGNOSIS — M5137 Other intervertebral disc degeneration, lumbosacral region: Secondary | ICD-10-CM | POA: Diagnosis not present

## 2019-02-19 DIAGNOSIS — M5441 Lumbago with sciatica, right side: Secondary | ICD-10-CM | POA: Diagnosis not present

## 2019-02-19 DIAGNOSIS — M4126 Other idiopathic scoliosis, lumbar region: Secondary | ICD-10-CM | POA: Diagnosis not present

## 2019-02-19 DIAGNOSIS — M9903 Segmental and somatic dysfunction of lumbar region: Secondary | ICD-10-CM | POA: Diagnosis not present

## 2019-02-19 DIAGNOSIS — M9901 Segmental and somatic dysfunction of cervical region: Secondary | ICD-10-CM | POA: Diagnosis not present

## 2019-02-19 DIAGNOSIS — M40202 Unspecified kyphosis, cervical region: Secondary | ICD-10-CM | POA: Diagnosis not present

## 2019-02-19 DIAGNOSIS — M5442 Lumbago with sciatica, left side: Secondary | ICD-10-CM | POA: Diagnosis not present

## 2019-02-24 DIAGNOSIS — M5137 Other intervertebral disc degeneration, lumbosacral region: Secondary | ICD-10-CM | POA: Diagnosis not present

## 2019-02-24 DIAGNOSIS — M9903 Segmental and somatic dysfunction of lumbar region: Secondary | ICD-10-CM | POA: Diagnosis not present

## 2019-02-24 DIAGNOSIS — M9901 Segmental and somatic dysfunction of cervical region: Secondary | ICD-10-CM | POA: Diagnosis not present

## 2019-02-24 DIAGNOSIS — M40202 Unspecified kyphosis, cervical region: Secondary | ICD-10-CM | POA: Diagnosis not present

## 2019-02-24 DIAGNOSIS — M5441 Lumbago with sciatica, right side: Secondary | ICD-10-CM | POA: Diagnosis not present

## 2019-02-24 DIAGNOSIS — M9904 Segmental and somatic dysfunction of sacral region: Secondary | ICD-10-CM | POA: Diagnosis not present

## 2019-02-24 DIAGNOSIS — M4126 Other idiopathic scoliosis, lumbar region: Secondary | ICD-10-CM | POA: Diagnosis not present

## 2019-02-24 DIAGNOSIS — M5442 Lumbago with sciatica, left side: Secondary | ICD-10-CM | POA: Diagnosis not present

## 2019-02-24 DIAGNOSIS — M9902 Segmental and somatic dysfunction of thoracic region: Secondary | ICD-10-CM | POA: Diagnosis not present

## 2019-02-25 DIAGNOSIS — M9904 Segmental and somatic dysfunction of sacral region: Secondary | ICD-10-CM | POA: Diagnosis not present

## 2019-02-25 DIAGNOSIS — M9902 Segmental and somatic dysfunction of thoracic region: Secondary | ICD-10-CM | POA: Diagnosis not present

## 2019-02-25 DIAGNOSIS — M5137 Other intervertebral disc degeneration, lumbosacral region: Secondary | ICD-10-CM | POA: Diagnosis not present

## 2019-02-25 DIAGNOSIS — M9903 Segmental and somatic dysfunction of lumbar region: Secondary | ICD-10-CM | POA: Diagnosis not present

## 2019-02-25 DIAGNOSIS — M4126 Other idiopathic scoliosis, lumbar region: Secondary | ICD-10-CM | POA: Diagnosis not present

## 2019-02-25 DIAGNOSIS — M40202 Unspecified kyphosis, cervical region: Secondary | ICD-10-CM | POA: Diagnosis not present

## 2019-02-25 DIAGNOSIS — M9901 Segmental and somatic dysfunction of cervical region: Secondary | ICD-10-CM | POA: Diagnosis not present

## 2019-02-25 DIAGNOSIS — M5442 Lumbago with sciatica, left side: Secondary | ICD-10-CM | POA: Diagnosis not present

## 2019-02-25 DIAGNOSIS — M5441 Lumbago with sciatica, right side: Secondary | ICD-10-CM | POA: Diagnosis not present

## 2019-03-03 DIAGNOSIS — M9902 Segmental and somatic dysfunction of thoracic region: Secondary | ICD-10-CM | POA: Diagnosis not present

## 2019-03-03 DIAGNOSIS — M5137 Other intervertebral disc degeneration, lumbosacral region: Secondary | ICD-10-CM | POA: Diagnosis not present

## 2019-03-03 DIAGNOSIS — M5441 Lumbago with sciatica, right side: Secondary | ICD-10-CM | POA: Diagnosis not present

## 2019-03-03 DIAGNOSIS — M4126 Other idiopathic scoliosis, lumbar region: Secondary | ICD-10-CM | POA: Diagnosis not present

## 2019-03-03 DIAGNOSIS — M40202 Unspecified kyphosis, cervical region: Secondary | ICD-10-CM | POA: Diagnosis not present

## 2019-03-03 DIAGNOSIS — M9904 Segmental and somatic dysfunction of sacral region: Secondary | ICD-10-CM | POA: Diagnosis not present

## 2019-03-03 DIAGNOSIS — M5442 Lumbago with sciatica, left side: Secondary | ICD-10-CM | POA: Diagnosis not present

## 2019-03-03 DIAGNOSIS — M9903 Segmental and somatic dysfunction of lumbar region: Secondary | ICD-10-CM | POA: Diagnosis not present

## 2019-03-03 DIAGNOSIS — M9901 Segmental and somatic dysfunction of cervical region: Secondary | ICD-10-CM | POA: Diagnosis not present

## 2019-03-04 DIAGNOSIS — M9904 Segmental and somatic dysfunction of sacral region: Secondary | ICD-10-CM | POA: Diagnosis not present

## 2019-03-04 DIAGNOSIS — M9903 Segmental and somatic dysfunction of lumbar region: Secondary | ICD-10-CM | POA: Diagnosis not present

## 2019-03-04 DIAGNOSIS — M5137 Other intervertebral disc degeneration, lumbosacral region: Secondary | ICD-10-CM | POA: Diagnosis not present

## 2019-03-04 DIAGNOSIS — M5441 Lumbago with sciatica, right side: Secondary | ICD-10-CM | POA: Diagnosis not present

## 2019-03-04 DIAGNOSIS — M5442 Lumbago with sciatica, left side: Secondary | ICD-10-CM | POA: Diagnosis not present

## 2019-03-04 DIAGNOSIS — M9901 Segmental and somatic dysfunction of cervical region: Secondary | ICD-10-CM | POA: Diagnosis not present

## 2019-03-04 DIAGNOSIS — M4126 Other idiopathic scoliosis, lumbar region: Secondary | ICD-10-CM | POA: Diagnosis not present

## 2019-03-04 DIAGNOSIS — M9902 Segmental and somatic dysfunction of thoracic region: Secondary | ICD-10-CM | POA: Diagnosis not present

## 2019-03-04 DIAGNOSIS — M40202 Unspecified kyphosis, cervical region: Secondary | ICD-10-CM | POA: Diagnosis not present

## 2019-03-10 DIAGNOSIS — M9901 Segmental and somatic dysfunction of cervical region: Secondary | ICD-10-CM | POA: Diagnosis not present

## 2019-03-10 DIAGNOSIS — M4126 Other idiopathic scoliosis, lumbar region: Secondary | ICD-10-CM | POA: Diagnosis not present

## 2019-03-10 DIAGNOSIS — M5442 Lumbago with sciatica, left side: Secondary | ICD-10-CM | POA: Diagnosis not present

## 2019-03-10 DIAGNOSIS — M40202 Unspecified kyphosis, cervical region: Secondary | ICD-10-CM | POA: Diagnosis not present

## 2019-03-10 DIAGNOSIS — M5137 Other intervertebral disc degeneration, lumbosacral region: Secondary | ICD-10-CM | POA: Diagnosis not present

## 2019-03-10 DIAGNOSIS — M9903 Segmental and somatic dysfunction of lumbar region: Secondary | ICD-10-CM | POA: Diagnosis not present

## 2019-03-10 DIAGNOSIS — M5441 Lumbago with sciatica, right side: Secondary | ICD-10-CM | POA: Diagnosis not present

## 2019-03-10 DIAGNOSIS — M9904 Segmental and somatic dysfunction of sacral region: Secondary | ICD-10-CM | POA: Diagnosis not present

## 2019-03-10 DIAGNOSIS — M9902 Segmental and somatic dysfunction of thoracic region: Secondary | ICD-10-CM | POA: Diagnosis not present

## 2019-03-10 MED FILL — SERTRALINE HCL 50 MG TABLET: 50 | 30 days supply | Qty: 30 | Fill #1

## 2019-03-17 DIAGNOSIS — M5441 Lumbago with sciatica, right side: Secondary | ICD-10-CM | POA: Diagnosis not present

## 2019-03-17 DIAGNOSIS — M9902 Segmental and somatic dysfunction of thoracic region: Secondary | ICD-10-CM | POA: Diagnosis not present

## 2019-03-17 DIAGNOSIS — M4126 Other idiopathic scoliosis, lumbar region: Secondary | ICD-10-CM | POA: Diagnosis not present

## 2019-03-17 DIAGNOSIS — M9903 Segmental and somatic dysfunction of lumbar region: Secondary | ICD-10-CM | POA: Diagnosis not present

## 2019-03-17 DIAGNOSIS — M9904 Segmental and somatic dysfunction of sacral region: Secondary | ICD-10-CM | POA: Diagnosis not present

## 2019-03-17 DIAGNOSIS — M9901 Segmental and somatic dysfunction of cervical region: Secondary | ICD-10-CM | POA: Diagnosis not present

## 2019-03-17 DIAGNOSIS — M40202 Unspecified kyphosis, cervical region: Secondary | ICD-10-CM | POA: Diagnosis not present

## 2019-03-17 DIAGNOSIS — M5442 Lumbago with sciatica, left side: Secondary | ICD-10-CM | POA: Diagnosis not present

## 2019-03-17 DIAGNOSIS — M5137 Other intervertebral disc degeneration, lumbosacral region: Secondary | ICD-10-CM | POA: Diagnosis not present

## 2019-03-18 DIAGNOSIS — M5442 Lumbago with sciatica, left side: Secondary | ICD-10-CM | POA: Diagnosis not present

## 2019-03-18 DIAGNOSIS — M9903 Segmental and somatic dysfunction of lumbar region: Secondary | ICD-10-CM | POA: Diagnosis not present

## 2019-03-18 DIAGNOSIS — M4126 Other idiopathic scoliosis, lumbar region: Secondary | ICD-10-CM | POA: Diagnosis not present

## 2019-03-18 DIAGNOSIS — M9902 Segmental and somatic dysfunction of thoracic region: Secondary | ICD-10-CM | POA: Diagnosis not present

## 2019-03-18 DIAGNOSIS — M5137 Other intervertebral disc degeneration, lumbosacral region: Secondary | ICD-10-CM | POA: Diagnosis not present

## 2019-03-18 DIAGNOSIS — M5441 Lumbago with sciatica, right side: Secondary | ICD-10-CM | POA: Diagnosis not present

## 2019-03-18 DIAGNOSIS — M9901 Segmental and somatic dysfunction of cervical region: Secondary | ICD-10-CM | POA: Diagnosis not present

## 2019-03-18 DIAGNOSIS — M9904 Segmental and somatic dysfunction of sacral region: Secondary | ICD-10-CM | POA: Diagnosis not present

## 2019-03-18 DIAGNOSIS — M40202 Unspecified kyphosis, cervical region: Secondary | ICD-10-CM | POA: Diagnosis not present

## 2019-03-25 DIAGNOSIS — M5137 Other intervertebral disc degeneration, lumbosacral region: Secondary | ICD-10-CM | POA: Diagnosis not present

## 2019-03-25 DIAGNOSIS — M40202 Unspecified kyphosis, cervical region: Secondary | ICD-10-CM | POA: Diagnosis not present

## 2019-03-25 DIAGNOSIS — M9901 Segmental and somatic dysfunction of cervical region: Secondary | ICD-10-CM | POA: Diagnosis not present

## 2019-03-25 DIAGNOSIS — M4126 Other idiopathic scoliosis, lumbar region: Secondary | ICD-10-CM | POA: Diagnosis not present

## 2019-03-25 DIAGNOSIS — M9903 Segmental and somatic dysfunction of lumbar region: Secondary | ICD-10-CM | POA: Diagnosis not present

## 2019-03-25 DIAGNOSIS — M5441 Lumbago with sciatica, right side: Secondary | ICD-10-CM | POA: Diagnosis not present

## 2019-03-25 DIAGNOSIS — M9902 Segmental and somatic dysfunction of thoracic region: Secondary | ICD-10-CM | POA: Diagnosis not present

## 2019-03-25 DIAGNOSIS — M5442 Lumbago with sciatica, left side: Secondary | ICD-10-CM | POA: Diagnosis not present

## 2019-03-25 DIAGNOSIS — M9904 Segmental and somatic dysfunction of sacral region: Secondary | ICD-10-CM | POA: Diagnosis not present

## 2019-03-26 DIAGNOSIS — M9902 Segmental and somatic dysfunction of thoracic region: Secondary | ICD-10-CM | POA: Diagnosis not present

## 2019-03-26 DIAGNOSIS — M9901 Segmental and somatic dysfunction of cervical region: Secondary | ICD-10-CM | POA: Diagnosis not present

## 2019-03-26 DIAGNOSIS — M40202 Unspecified kyphosis, cervical region: Secondary | ICD-10-CM | POA: Diagnosis not present

## 2019-03-26 DIAGNOSIS — M5442 Lumbago with sciatica, left side: Secondary | ICD-10-CM | POA: Diagnosis not present

## 2019-03-26 DIAGNOSIS — M5441 Lumbago with sciatica, right side: Secondary | ICD-10-CM | POA: Diagnosis not present

## 2019-03-26 DIAGNOSIS — M5137 Other intervertebral disc degeneration, lumbosacral region: Secondary | ICD-10-CM | POA: Diagnosis not present

## 2019-03-26 DIAGNOSIS — M9903 Segmental and somatic dysfunction of lumbar region: Secondary | ICD-10-CM | POA: Diagnosis not present

## 2019-03-26 DIAGNOSIS — M9904 Segmental and somatic dysfunction of sacral region: Secondary | ICD-10-CM | POA: Diagnosis not present

## 2019-03-26 DIAGNOSIS — M4126 Other idiopathic scoliosis, lumbar region: Secondary | ICD-10-CM | POA: Diagnosis not present

## 2019-03-31 DIAGNOSIS — M5441 Lumbago with sciatica, right side: Secondary | ICD-10-CM | POA: Diagnosis not present

## 2019-03-31 DIAGNOSIS — M4126 Other idiopathic scoliosis, lumbar region: Secondary | ICD-10-CM | POA: Diagnosis not present

## 2019-03-31 DIAGNOSIS — M5137 Other intervertebral disc degeneration, lumbosacral region: Secondary | ICD-10-CM | POA: Diagnosis not present

## 2019-03-31 DIAGNOSIS — M9902 Segmental and somatic dysfunction of thoracic region: Secondary | ICD-10-CM | POA: Diagnosis not present

## 2019-03-31 DIAGNOSIS — M9901 Segmental and somatic dysfunction of cervical region: Secondary | ICD-10-CM | POA: Diagnosis not present

## 2019-03-31 DIAGNOSIS — M5442 Lumbago with sciatica, left side: Secondary | ICD-10-CM | POA: Diagnosis not present

## 2019-03-31 DIAGNOSIS — M9903 Segmental and somatic dysfunction of lumbar region: Secondary | ICD-10-CM | POA: Diagnosis not present

## 2019-03-31 DIAGNOSIS — M40202 Unspecified kyphosis, cervical region: Secondary | ICD-10-CM | POA: Diagnosis not present

## 2019-03-31 DIAGNOSIS — M9904 Segmental and somatic dysfunction of sacral region: Secondary | ICD-10-CM | POA: Diagnosis not present

## 2019-04-01 DIAGNOSIS — M9904 Segmental and somatic dysfunction of sacral region: Secondary | ICD-10-CM | POA: Diagnosis not present

## 2019-04-01 DIAGNOSIS — M40202 Unspecified kyphosis, cervical region: Secondary | ICD-10-CM | POA: Diagnosis not present

## 2019-04-01 DIAGNOSIS — M5442 Lumbago with sciatica, left side: Secondary | ICD-10-CM | POA: Diagnosis not present

## 2019-04-01 DIAGNOSIS — M9901 Segmental and somatic dysfunction of cervical region: Secondary | ICD-10-CM | POA: Diagnosis not present

## 2019-04-01 DIAGNOSIS — M5441 Lumbago with sciatica, right side: Secondary | ICD-10-CM | POA: Diagnosis not present

## 2019-04-01 DIAGNOSIS — M4126 Other idiopathic scoliosis, lumbar region: Secondary | ICD-10-CM | POA: Diagnosis not present

## 2019-04-01 DIAGNOSIS — M9902 Segmental and somatic dysfunction of thoracic region: Secondary | ICD-10-CM | POA: Diagnosis not present

## 2019-04-01 DIAGNOSIS — M9903 Segmental and somatic dysfunction of lumbar region: Secondary | ICD-10-CM | POA: Diagnosis not present

## 2019-04-01 DIAGNOSIS — M5137 Other intervertebral disc degeneration, lumbosacral region: Secondary | ICD-10-CM | POA: Diagnosis not present

## 2019-04-09 DIAGNOSIS — M9902 Segmental and somatic dysfunction of thoracic region: Secondary | ICD-10-CM | POA: Diagnosis not present

## 2019-04-09 DIAGNOSIS — M9903 Segmental and somatic dysfunction of lumbar region: Secondary | ICD-10-CM | POA: Diagnosis not present

## 2019-04-09 DIAGNOSIS — M5442 Lumbago with sciatica, left side: Secondary | ICD-10-CM | POA: Diagnosis not present

## 2019-04-09 DIAGNOSIS — M9901 Segmental and somatic dysfunction of cervical region: Secondary | ICD-10-CM | POA: Diagnosis not present

## 2019-04-09 DIAGNOSIS — M5441 Lumbago with sciatica, right side: Secondary | ICD-10-CM | POA: Diagnosis not present

## 2019-04-09 DIAGNOSIS — M9904 Segmental and somatic dysfunction of sacral region: Secondary | ICD-10-CM | POA: Diagnosis not present

## 2019-04-09 DIAGNOSIS — M4126 Other idiopathic scoliosis, lumbar region: Secondary | ICD-10-CM | POA: Diagnosis not present

## 2019-04-09 DIAGNOSIS — M40202 Unspecified kyphosis, cervical region: Secondary | ICD-10-CM | POA: Diagnosis not present

## 2019-04-09 DIAGNOSIS — M5137 Other intervertebral disc degeneration, lumbosacral region: Secondary | ICD-10-CM | POA: Diagnosis not present

## 2019-04-10 DIAGNOSIS — M5442 Lumbago with sciatica, left side: Secondary | ICD-10-CM | POA: Diagnosis not present

## 2019-04-10 DIAGNOSIS — M40202 Unspecified kyphosis, cervical region: Secondary | ICD-10-CM | POA: Diagnosis not present

## 2019-04-10 DIAGNOSIS — M4126 Other idiopathic scoliosis, lumbar region: Secondary | ICD-10-CM | POA: Diagnosis not present

## 2019-04-10 DIAGNOSIS — M9902 Segmental and somatic dysfunction of thoracic region: Secondary | ICD-10-CM | POA: Diagnosis not present

## 2019-04-10 DIAGNOSIS — M9904 Segmental and somatic dysfunction of sacral region: Secondary | ICD-10-CM | POA: Diagnosis not present

## 2019-04-10 DIAGNOSIS — M9901 Segmental and somatic dysfunction of cervical region: Secondary | ICD-10-CM | POA: Diagnosis not present

## 2019-04-10 DIAGNOSIS — M5137 Other intervertebral disc degeneration, lumbosacral region: Secondary | ICD-10-CM | POA: Diagnosis not present

## 2019-04-10 DIAGNOSIS — M9903 Segmental and somatic dysfunction of lumbar region: Secondary | ICD-10-CM | POA: Diagnosis not present

## 2019-04-10 DIAGNOSIS — M5441 Lumbago with sciatica, right side: Secondary | ICD-10-CM | POA: Diagnosis not present

## 2019-04-15 DIAGNOSIS — M9903 Segmental and somatic dysfunction of lumbar region: Secondary | ICD-10-CM | POA: Diagnosis not present

## 2019-04-15 DIAGNOSIS — M4126 Other idiopathic scoliosis, lumbar region: Secondary | ICD-10-CM | POA: Diagnosis not present

## 2019-04-15 DIAGNOSIS — M9904 Segmental and somatic dysfunction of sacral region: Secondary | ICD-10-CM | POA: Diagnosis not present

## 2019-04-15 DIAGNOSIS — M9901 Segmental and somatic dysfunction of cervical region: Secondary | ICD-10-CM | POA: Diagnosis not present

## 2019-04-15 DIAGNOSIS — M5137 Other intervertebral disc degeneration, lumbosacral region: Secondary | ICD-10-CM | POA: Diagnosis not present

## 2019-04-15 DIAGNOSIS — M5441 Lumbago with sciatica, right side: Secondary | ICD-10-CM | POA: Diagnosis not present

## 2019-04-15 DIAGNOSIS — M9902 Segmental and somatic dysfunction of thoracic region: Secondary | ICD-10-CM | POA: Diagnosis not present

## 2019-04-15 DIAGNOSIS — M40202 Unspecified kyphosis, cervical region: Secondary | ICD-10-CM | POA: Diagnosis not present

## 2019-04-15 DIAGNOSIS — M5442 Lumbago with sciatica, left side: Secondary | ICD-10-CM | POA: Diagnosis not present

## 2019-04-16 DIAGNOSIS — M5442 Lumbago with sciatica, left side: Secondary | ICD-10-CM | POA: Diagnosis not present

## 2019-04-16 DIAGNOSIS — M9904 Segmental and somatic dysfunction of sacral region: Secondary | ICD-10-CM | POA: Diagnosis not present

## 2019-04-16 DIAGNOSIS — M40202 Unspecified kyphosis, cervical region: Secondary | ICD-10-CM | POA: Diagnosis not present

## 2019-04-16 DIAGNOSIS — M9902 Segmental and somatic dysfunction of thoracic region: Secondary | ICD-10-CM | POA: Diagnosis not present

## 2019-04-16 DIAGNOSIS — M5441 Lumbago with sciatica, right side: Secondary | ICD-10-CM | POA: Diagnosis not present

## 2019-04-16 DIAGNOSIS — M4126 Other idiopathic scoliosis, lumbar region: Secondary | ICD-10-CM | POA: Diagnosis not present

## 2019-04-16 DIAGNOSIS — M9901 Segmental and somatic dysfunction of cervical region: Secondary | ICD-10-CM | POA: Diagnosis not present

## 2019-04-16 DIAGNOSIS — M5137 Other intervertebral disc degeneration, lumbosacral region: Secondary | ICD-10-CM | POA: Diagnosis not present

## 2019-04-16 DIAGNOSIS — M9903 Segmental and somatic dysfunction of lumbar region: Secondary | ICD-10-CM | POA: Diagnosis not present

## 2019-04-18 MED FILL — SERTRALINE HCL 50 MG TABLET: 50 | 30 days supply | Qty: 30 | Fill #2

## 2019-04-21 ENCOUNTER — Other Ambulatory Visit: Payer: Self-pay | Admitting: Family Medicine

## 2019-04-21 DIAGNOSIS — M5137 Other intervertebral disc degeneration, lumbosacral region: Secondary | ICD-10-CM | POA: Diagnosis not present

## 2019-04-21 DIAGNOSIS — M4126 Other idiopathic scoliosis, lumbar region: Secondary | ICD-10-CM | POA: Diagnosis not present

## 2019-04-21 DIAGNOSIS — M9903 Segmental and somatic dysfunction of lumbar region: Secondary | ICD-10-CM | POA: Diagnosis not present

## 2019-04-21 DIAGNOSIS — M9902 Segmental and somatic dysfunction of thoracic region: Secondary | ICD-10-CM | POA: Diagnosis not present

## 2019-04-21 DIAGNOSIS — M9904 Segmental and somatic dysfunction of sacral region: Secondary | ICD-10-CM | POA: Diagnosis not present

## 2019-04-21 DIAGNOSIS — M9901 Segmental and somatic dysfunction of cervical region: Secondary | ICD-10-CM | POA: Diagnosis not present

## 2019-04-21 DIAGNOSIS — M5442 Lumbago with sciatica, left side: Secondary | ICD-10-CM | POA: Diagnosis not present

## 2019-04-21 DIAGNOSIS — M5441 Lumbago with sciatica, right side: Secondary | ICD-10-CM | POA: Diagnosis not present

## 2019-04-21 DIAGNOSIS — M40202 Unspecified kyphosis, cervical region: Secondary | ICD-10-CM | POA: Diagnosis not present

## 2019-04-21 MED FILL — METOPROLOL SUCCINATE ER 25: 25 | 90 days supply | Qty: 90 | Fill #0

## 2019-06-23 ENCOUNTER — Other Ambulatory Visit: Payer: Self-pay | Admitting: Physician Assistant

## 2019-06-23 DIAGNOSIS — F419 Anxiety disorder, unspecified: Secondary | ICD-10-CM

## 2019-06-23 MED FILL — SERTRALINE HCL 50 MG TABS: 50 | 30 days supply | Qty: 30 | Fill #0

## 2019-06-23 MED FILL — FLUTICASONE PROP 50 MCG SPR: 50 | 30 days supply | Qty: 16 | Fill #1

## 2019-07-04 ENCOUNTER — Encounter: Payer: Self-pay | Admitting: Family Medicine

## 2019-07-04 ENCOUNTER — Ambulatory Visit (INDEPENDENT_AMBULATORY_CARE_PROVIDER_SITE_OTHER): Payer: 59 | Admitting: Family Medicine

## 2019-07-04 ENCOUNTER — Other Ambulatory Visit: Payer: Self-pay

## 2019-07-04 VITALS — BP 120/82 | HR 102 | Temp 98.0°F | Resp 16 | Ht 66.0 in | Wt 143.4 lb

## 2019-07-04 DIAGNOSIS — I10 Essential (primary) hypertension: Secondary | ICD-10-CM

## 2019-07-04 DIAGNOSIS — R7309 Other abnormal glucose: Secondary | ICD-10-CM | POA: Diagnosis not present

## 2019-07-04 DIAGNOSIS — Z0000 Encounter for general adult medical examination without abnormal findings: Secondary | ICD-10-CM | POA: Diagnosis not present

## 2019-07-04 DIAGNOSIS — Z8616 Personal history of COVID-19: Secondary | ICD-10-CM | POA: Diagnosis not present

## 2019-07-04 NOTE — Assessment & Plan Note (Signed)
Pt's PE WNL.  UTD on immunizations.  Check labs.  Anticipatory guidance provided.  

## 2019-07-04 NOTE — Patient Instructions (Signed)
Follow up in 6 months to recheck mood and BP We'll notify you of your lab results and make any changes if needed Keep up the good work on healthy diet and regular exercise- you're doing great! Call with any questions or concerns Have a great summer!!!

## 2019-07-04 NOTE — Progress Notes (Signed)
   Subjective:    Patient ID: Miguel Love, male    DOB: Oct 04, 1990, 29 y.o.   MRN: [id number]  HPI CPE- UTD on immunizations.  Pt reports doing well.   Review of Systems Patient reports no vision/hearing changes, anorexia, fever ,adenopathy, persistant/recurrent hoarseness, swallowing issues, chest pain, palpitations, edema, persistant/recurrent cough, hemoptysis, dyspnea (rest,exertional, paroxysmal nocturnal), gastrointestinal  bleeding (melena, rectal bleeding), abdominal pain, excessive heart burn, GU symptoms (dysuria, hematuria, voiding/incontinence issues) syncope, focal weakness, memory loss, numbness & tingling, skin/hair/nail changes, depression, anxiety, abnormal bruising/bleeding, musculoskeletal symptoms/signs.   This visit occurred during the SARS-CoV-2 public health emergency.  Safety protocols were in place, including screening questions prior to the visit, additional usage of staff PPE, and extensive cleaning of exam room while observing appropriate contact time as indicated for disinfecting solutions.       Objective:   Physical Exam General Appearance:    Alert, cooperative, no distress, appears stated age  Head:    Normocephalic, without obvious abnormality, atraumatic  Eyes:    PERRL, conjunctiva/corneas clear, EOM's intact, fundi    benign, both eyes       Ears:    Normal TM's and external ear canals, both ears  Nose:   Deferred due to COVID  Throat:   Neck:   Supple, symmetrical, trachea midline, no adenopathy;       thyroid:  No enlargement/tenderness/nodules  Back:     Symmetric, no curvature, ROM normal, no CVA tenderness  Lungs:     Clear to auscultation bilaterally, respirations unlabored  Chest wall:    No tenderness or deformity  Heart:    Regular rate and rhythm, S1 and S2 normal, no murmur, rub   or gallop  Abdomen:     Soft, non-tender, bowel sounds active all four quadrants,    no masses, no organomegaly  Genitalia:    Deferred  Rectal:      Extremities:   Extremities normal, atraumatic, no cyanosis or edema  Pulses:   2+ and symmetric all extremities  Skin:   Skin color, texture, turgor normal, no rashes or lesions  Lymph nodes:   Cervical, supraclavicular, and axillary nodes normal  Neurologic:   CNII-XII intact. Normal strength, sensation and reflexes      throughout          Assessment & Plan:

## 2019-07-04 NOTE — Assessment & Plan Note (Signed)
Chronic problem.  Well controlled.  Asymptomatic.  No changes.

## 2019-07-07 ENCOUNTER — Other Ambulatory Visit: Payer: Self-pay | Admitting: General Practice

## 2019-07-07 ENCOUNTER — Encounter: Payer: Self-pay | Admitting: Family Medicine

## 2019-07-07 DIAGNOSIS — R7989 Other specified abnormal findings of blood chemistry: Secondary | ICD-10-CM

## 2019-07-09 LAB — LIPID PANEL
Cholesterol: 151 mg/dL (ref <200)
HDL: 63 mg/dL (ref >=40)
LDL Cholesterol (Calc): 70 mg/dL (calc)
Non-HDL Cholesterol (Calc): 88 mg/dL (calc) (ref <130)
Total CHOL/HDL Ratio: 2.4 (calc) (ref <5.0)
Triglycerides: 95 mg/dL (ref <150)

## 2019-07-09 LAB — TEST AUTHORIZATION

## 2019-07-09 LAB — CBC WITH DIFFERENTIAL/PLATELET
Absolute Monocytes: 279 cells/uL (ref 200–950)
Basophils Absolute: 41 cells/uL (ref 0–200)
Basophils Relative: 1 %
Eosinophils Absolute: 82 cells/uL (ref 15–500)
Eosinophils Relative: 2 %
HCT: 49.4 % (ref 38.5–50.0)
Hemoglobin: 17.3 g/dL — ABNORMAL HIGH (ref 13.2–17.1)
Lymphs Abs: 1341 cells/uL (ref 850–3900)
MCH: 30.7 pg (ref 27.0–33.0)
MCHC: 35 g/dL (ref 32.0–36.0)
MCV: 87.7 fL (ref 80.0–100.0)
MPV: 9.5 fL (ref 7.5–12.5)
Monocytes Relative: 6.8 %
Neutro Abs: 2358 cells/uL (ref 1500–7800)
Neutrophils Relative %: 57.5 %
Platelets: 234 10*3/uL (ref 140–400)
RBC: 5.63 10*6/uL (ref 4.20–5.80)
RDW: 12.1 % (ref 11.0–15.0)
Total Lymphocyte: 32.7 %
WBC: 4.1 10*3/uL (ref 3.8–10.8)

## 2019-07-09 LAB — HEPATIC FUNCTION PANEL
AG Ratio: 2 (calc) (ref 1.0–2.5)
ALT: 75 U/L — ABNORMAL HIGH (ref 9–46)
AST: 38 U/L (ref 10–40)
Albumin: 4.6 g/dL (ref 3.6–5.1)
Alkaline phosphatase (APISO): 69 U/L (ref 36–130)
Bilirubin, Direct: 0.2 mg/dL (ref 0.0–0.2)
Globulin: 2.3 g/dL (calc) (ref 1.9–3.7)
Indirect Bilirubin: 0.7 mg/dL (calc) (ref 0.2–1.2)
Total Bilirubin: 0.9 mg/dL (ref 0.2–1.2)
Total Protein: 6.9 g/dL (ref 6.1–8.1)

## 2019-07-09 LAB — BASIC METABOLIC PANEL
BUN: 11 mg/dL (ref 7–25)
CO2: 25 mmol/L (ref 20–32)
Calcium: 9.8 mg/dL (ref 8.6–10.3)
Chloride: 102 mmol/L (ref 98–110)
Creat: 1.06 mg/dL (ref 0.60–1.35)
Glucose, Bld: 174 mg/dL — ABNORMAL HIGH (ref 65–99)
Potassium: 3.6 mmol/L (ref 3.5–5.3)
Sodium: 138 mmol/L (ref 135–146)

## 2019-07-09 LAB — HEMOGLOBIN A1C W/OUT EAG: Hgb A1c MFr Bld: 4.8 % of total Hgb (ref <5.7)

## 2019-07-09 LAB — TSH: TSH: 1 mIU/L (ref 0.40–4.50)

## 2019-07-17 DIAGNOSIS — H5213 Myopia, bilateral: Secondary | ICD-10-CM | POA: Diagnosis not present

## 2019-07-28 ENCOUNTER — Encounter: Payer: Self-pay | Admitting: Family Medicine

## 2019-07-28 ENCOUNTER — Ambulatory Visit (INDEPENDENT_AMBULATORY_CARE_PROVIDER_SITE_OTHER): Payer: 59

## 2019-07-28 ENCOUNTER — Other Ambulatory Visit: Payer: Self-pay

## 2019-07-28 DIAGNOSIS — R7989 Other specified abnormal findings of blood chemistry: Secondary | ICD-10-CM

## 2019-07-28 LAB — HEPATIC FUNCTION PANEL
ALT: 69 U/L — ABNORMAL HIGH (ref 0–53)
AST: 35 U/L (ref 0–37)
Albumin: 4.5 g/dL (ref 3.5–5.2)
Alkaline Phosphatase: 57 U/L (ref 39–117)
Bilirubin, Direct: 0.2 mg/dL (ref 0.0–0.3)
Total Bilirubin: 0.9 mg/dL (ref 0.2–1.2)
Total Protein: 6.6 g/dL (ref 6.0–8.3)

## 2019-07-28 MED FILL — SERTRALINE HCL 50 MG TABLET: 50 | 30 days supply | Qty: 30 | Fill #1

## 2019-07-28 MED FILL — METOPROLOL SUCCINATE ER 25: 25 | 90 days supply | Qty: 90 | Fill #1

## 2019-08-20 ENCOUNTER — Other Ambulatory Visit: Payer: Self-pay

## 2019-08-20 ENCOUNTER — Ambulatory Visit: Payer: 59 | Admitting: Dermatology

## 2019-08-20 DIAGNOSIS — Z1283 Encounter for screening for malignant neoplasm of skin: Secondary | ICD-10-CM

## 2019-08-20 DIAGNOSIS — L813 Cafe au lait spots: Secondary | ICD-10-CM

## 2019-08-20 DIAGNOSIS — L649 Androgenic alopecia, unspecified: Secondary | ICD-10-CM | POA: Diagnosis not present

## 2019-08-20 DIAGNOSIS — L814 Other melanin hyperpigmentation: Secondary | ICD-10-CM

## 2019-08-20 DIAGNOSIS — D229 Melanocytic nevi, unspecified: Secondary | ICD-10-CM

## 2019-08-20 DIAGNOSIS — D2271 Melanocytic nevi of right lower limb, including hip: Secondary | ICD-10-CM

## 2019-08-20 DIAGNOSIS — L821 Other seborrheic keratosis: Secondary | ICD-10-CM

## 2019-08-20 DIAGNOSIS — L578 Other skin changes due to chronic exposure to nonionizing radiation: Secondary | ICD-10-CM

## 2019-08-20 NOTE — Patient Instructions (Addendum)

## 2019-08-20 NOTE — Progress Notes (Signed)
° °  Follow-Up Visit   Subjective  Miguel Love is a 29 y.o. male who presents for the following: Annual Exam (patient here today for TBSE).  Patient here today for full body skin exam and skin cancer screening. No history of any skin cancer. He also would like to talk about treatments available for androgenetic alopecia. He is using Rogaine.   The following portions of the chart were reviewed this encounter and updated as appropriate:  Tobacco   Allergies   Meds   Problems   Med Hx   Surg Hx   Fam Hx       Review of Systems:  No other skin or systemic complaints except as noted in HPI or Assessment and Plan.  Objective  Well appearing patient in no apparent distress; mood and affect are within normal limits.  A full examination was performed including scalp, head, eyes, ears, nose, lips, neck, chest, axillae, abdomen, back, buttocks, bilateral upper extremities, bilateral lower extremities, hands, feet, fingers, toes, fingernails, and toenails. All findings within normal limits unless otherwise noted below.  Objective  Scalp: Diffuse thinning at frontal and vertex scalp. Improved from prior.  Images          Objective  Right 1st-2nd Toe Web: 0.4cm dark brown macule  Right 3rd Toe: 0.15cm brown macule   Assessment & Plan  Androgenetic alopecia Scalp  Chronic. Not at goal. Cont Rogaine daily. Discussed po finasteride and side effects. Defered at this time. Recommend starting Nutrafol and red light cap. Discussed treatments with platelet rich plasma if not seeing improvement with Nutrafol and red light cap.   Nevus (2) Right 3rd Toe; Right 1st-2nd Toe Web  Benign-appearing.  Observation.  Call clinic for new or changing lesions.  Recommend daily use of broad spectrum spf 30+ sunscreen to sun-exposed areas.      Lentigines - Scattered tan macules - Discussed due to sun exposure - Benign, observe - Call for any changes  Seborrheic Keratoses - Stuck-on,  waxy, tan-brown papules and plaques  - Discussed benign etiology and prognosis. - Observe - Call for any changes  Melanocytic Nevi - Tan-brown and/or pink-flesh-colored symmetric macules and papules - Benign appearing on exam today - Observation - Call clinic for new or changing moles - Recommend daily use of broad spectrum spf 30+ sunscreen to sun-exposed areas.   Actinic Damage - diffuse scaly erythematous macules with underlying dyspigmentation - Recommend daily broad spectrum sunscreen SPF 30+ to sun-exposed areas, reapply every 2 hours as needed.  - Call for new or changing lesions.  Cafe au Lait  - Tan patch at right lower back - Genetic - Benign, observe - Call for any changes  Skin cancer screening performed today.  Return in about 1 year (around 08/19/2020) for TBSE.  Graciella Belton, RMA, am acting as scribe for Forest Gleason, MD .  Documentation: I have reviewed the above documentation for accuracy and completeness, and I agree with the above.  Forest Gleason, MD

## 2019-08-31 ENCOUNTER — Encounter: Payer: Self-pay | Admitting: Dermatology

## 2019-09-01 DIAGNOSIS — Z20828 Contact with and (suspected) exposure to other viral communicable diseases: Secondary | ICD-10-CM | POA: Diagnosis not present

## 2019-09-08 MED FILL — SERTRALINE HCL 50 MG TABLET: 50 | 30 days supply | Qty: 30 | Fill #2

## 2019-10-06 ENCOUNTER — Encounter: Payer: Self-pay | Admitting: Family Medicine

## 2019-10-09 MED FILL — FLUTICASONE PROP 50 MCG SPR: 50 | 30 days supply | Qty: 16 | Fill #2

## 2019-10-09 MED FILL — SERTRALINE HCL 50 MG TABLET: 50 | 30 days supply | Qty: 30 | Fill #3

## 2019-11-03 ENCOUNTER — Other Ambulatory Visit: Payer: Self-pay | Admitting: Family Medicine

## 2019-11-03 ENCOUNTER — Other Ambulatory Visit: Payer: Self-pay | Admitting: Physician Assistant

## 2019-11-03 DIAGNOSIS — F419 Anxiety disorder, unspecified: Secondary | ICD-10-CM

## 2019-11-03 MED FILL — FLUTICASONE PROP 50 MCG SPR: 50 | 30 days supply | Qty: 16 | Fill #3

## 2019-11-03 MED FILL — SERTRALINE HCL 50 MG TABLET: 50 | 30 days supply | Qty: 30 | Fill #0

## 2019-11-03 MED FILL — METOPROLOL SUCCINATE ER 25: 25 | 90 days supply | Qty: 90 | Fill #0

## 2019-12-13 MED FILL — SERTRALINE HCL 50 MG TABLET: 50 | 30 days supply | Qty: 30 | Fill #0

## 2019-12-29 ENCOUNTER — Encounter: Payer: Self-pay | Admitting: Family Medicine

## 2019-12-29 ENCOUNTER — Ambulatory Visit: Payer: 59 | Admitting: Family Medicine

## 2019-12-29 ENCOUNTER — Other Ambulatory Visit: Payer: Self-pay

## 2019-12-29 VITALS — BP 138/80 | HR 72 | Temp 97.9°F | Resp 19 | Ht 66.0 in | Wt 145.8 lb

## 2019-12-29 DIAGNOSIS — F419 Anxiety disorder, unspecified: Secondary | ICD-10-CM

## 2019-12-29 DIAGNOSIS — I10 Essential (primary) hypertension: Secondary | ICD-10-CM | POA: Diagnosis not present

## 2019-12-29 NOTE — Patient Instructions (Signed)
Schedule your complete physical in 6 months No need for labs today- yay! Keep up the good work! You look great! Call with any questions or concerns Stay Safe!  Stay Healthy! Happy Holidays!

## 2019-12-29 NOTE — Assessment & Plan Note (Signed)
Chronic problem.  Adequate control.  Asymptomatic.  No need for labs today.  Will follow.

## 2019-12-29 NOTE — Progress Notes (Signed)
   Subjective:    Patient ID: Miguel Love, male    DOB: [birth year], 29 y.o.   MRN: [id number]  HPI HTN- chronic problem.  On Metoprolol 25mg  daily w/ adequate control.  Denies CP, SOB, HAs, visual changes, edema.  Anxiety- ongoing issue for pt.  Doing well on Sertraline 50mg  daily.  Currently in counseling based on the trauma he has seen in the ICU   Review of Systems For ROS see HPI   This visit occurred during the SARS-CoV-2 public health emergency.  Safety protocols were in place, including screening questions prior to the visit, additional usage of staff PPE, and extensive cleaning of exam room while observing appropriate contact time as indicated for disinfecting solutions.       Objective:   Physical Exam Vitals reviewed.  Constitutional:      General: He is not in acute distress.    Appearance: Normal appearance. He is well-developed and well-nourished. He is not ill-appearing.  HENT:     Head: Normocephalic and atraumatic.  Eyes:     Extraocular Movements: EOM normal.     Conjunctiva/sclera: Conjunctivae normal.     Pupils: Pupils are equal, round, and reactive to light.  Neck:     Thyroid: No thyromegaly.  Cardiovascular:     Rate and Rhythm: Normal rate and regular rhythm.     Pulses: Intact distal pulses.     Heart sounds: Normal heart sounds. No murmur heard.   Pulmonary:     Effort: Pulmonary effort is normal. No respiratory distress.     Breath sounds: Normal breath sounds.  Abdominal:     General: Bowel sounds are normal. There is no distension.     Palpations: Abdomen is soft.  Musculoskeletal:        General: No edema.     Cervical back: Normal range of motion and neck supple.     Right lower leg: No edema.     Left lower leg: No edema.  Lymphadenopathy:     Cervical: No cervical adenopathy.  Skin:    General: Skin is warm and dry.  Neurological:     Mental Status: He is alert and oriented to person, place, and time.     Cranial Nerves: No  cranial nerve deficit.  Psychiatric:        Mood and Affect: Mood and affect normal.        Behavior: Behavior normal.           Assessment & Plan:

## 2019-12-29 NOTE — Assessment & Plan Note (Signed)
Well controlled on Sertraline 50mg  and regular counseling.  Applauded his efforts.  No changes at this time.

## 2020-01-07 MED FILL — SERTRALINE HCL 50 MG TABLET: 50 | 30 days supply | Qty: 30 | Fill #0

## 2020-01-23 ENCOUNTER — Other Ambulatory Visit: Payer: Self-pay | Admitting: Family Medicine

## 2020-01-23 ENCOUNTER — Encounter: Payer: Self-pay | Admitting: Family Medicine

## 2020-01-23 DIAGNOSIS — J302 Other seasonal allergic rhinitis: Secondary | ICD-10-CM

## 2020-01-23 MED FILL — FLUTICASONE PROP 50 MCG SPR: 50 | 30 days supply | Qty: 16 | Fill #0

## 2020-02-09 MED FILL — SERTRALINE HCL 50 MG TABLET: 50 | 30 days supply | Qty: 30 | Fill #1

## 2020-02-09 MED FILL — METOPROLOL SUCCINATE ER 25: 25 | 90 days supply | Qty: 90 | Fill #1

## 2020-02-10 ENCOUNTER — Emergency Department (HOSPITAL_COMMUNITY): Payer: PRIVATE HEALTH INSURANCE

## 2020-02-10 ENCOUNTER — Emergency Department (HOSPITAL_COMMUNITY)
Admission: EM | Admit: 2020-02-10 | Discharge: 2020-02-10 | Disposition: A | Discharge status: Home / Self Care | Payer: PRIVATE HEALTH INSURANCE | Attending: Emergency Medicine | Admitting: Emergency Medicine

## 2020-02-10 ENCOUNTER — Other Ambulatory Visit: Payer: Self-pay

## 2020-02-10 DIAGNOSIS — Y990 Civilian activity done for income or pay: Secondary | ICD-10-CM

## 2020-02-10 DIAGNOSIS — Y92481 Parking lot as the place of occurrence of the external cause: Secondary | ICD-10-CM | POA: Diagnosis not present

## 2020-02-10 DIAGNOSIS — J45909 Unspecified asthma, uncomplicated: Secondary | ICD-10-CM | POA: Diagnosis not present

## 2020-02-10 DIAGNOSIS — Z8616 Personal history of COVID-19: Secondary | ICD-10-CM | POA: Diagnosis not present

## 2020-02-10 DIAGNOSIS — W19XXXA Unspecified fall, initial encounter: Secondary | ICD-10-CM

## 2020-02-10 DIAGNOSIS — I10 Essential (primary) hypertension: Secondary | ICD-10-CM | POA: Insufficient documentation

## 2020-02-10 DIAGNOSIS — M25571 Pain in right ankle and joints of right foot: Secondary | ICD-10-CM | POA: Diagnosis not present

## 2020-02-10 DIAGNOSIS — S93401A Sprain of unspecified ligament of right ankle, initial encounter: Secondary | ICD-10-CM | POA: Diagnosis not present

## 2020-02-10 DIAGNOSIS — S99911A Unspecified injury of right ankle, initial encounter: Secondary | ICD-10-CM | POA: Diagnosis present

## 2020-02-10 DIAGNOSIS — W009XXA Unspecified fall due to ice and snow, initial encounter: Secondary | ICD-10-CM | POA: Insufficient documentation

## 2020-02-10 DIAGNOSIS — M7989 Other specified soft tissue disorders: Secondary | ICD-10-CM | POA: Insufficient documentation

## 2020-02-10 DIAGNOSIS — M25561 Pain in right knee: Secondary | ICD-10-CM | POA: Diagnosis not present

## 2020-02-10 DIAGNOSIS — T1490XA Injury, unspecified, initial encounter: Secondary | ICD-10-CM

## 2020-02-10 MED ORDER — HYDROCODONE-ACETAMINOPHEN 5-325 MG PO TABS
1.0000 | ORAL_TABLET | Freq: Once | ORAL | Status: AC
  Administered 2020-02-10: 1 via ORAL
  Filled 2020-02-10: qty 1

## 2020-02-10 NOTE — ED Provider Notes (Signed)
[facility] DEPT Provider Note   CSN: [id number] Arrival date & time: 02/10/20  [id number]     History Chief Complaint  Patient presents with  . Leg Pain    Miguel Love is a 30 y.o. male.  HPI    30 year old male comes in a chief complaint of fall.  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  He reports that he stepped out of his car and slipped on what appears to be black ice.  He fell onto his back.  He did not hear a pop, but he did twist his lower extremity while falling down.  He is having pain primarily over the right leg.  He has tender points over the right lateral thigh, right lateral leg and also the ankle.  He denies trauma elsewhere.  Medication review does not reveal patient being on any blood thinners.  Past Medical History:  Diagnosis Date  . Anxiety   . Asthma   . Cardiac arrhythmia due to congenital heart disease    pt denies 12/25/12  . Gallbladder polyp   . Hypertension   . Palpitations     Patient Active Problem List   Diagnosis Date Noted  . History of COVID-19 07/04/2019  . IBS (irritable bowel syndrome) 07/20/2014  . HTN (hypertension) 10/04/2012  . General medical examination 06/29/2011  . Childhood asthma 05/15/2011  . Elevated urine levels of catecholamines 05/15/2011  . Anxiety 05/15/2011  . Weight loss 05/15/2011    Past Surgical History:  Procedure Laterality Date  . WISDOM TOOTH EXTRACTION  2016       Family History  Problem Relation Age of Onset  . Hypertension Father   . Colon polyps Father   . Lung cancer Maternal Grandfather   . Hypertension Paternal Grandmother   . Stroke Paternal Grandmother   . Hypertension Paternal Grandfather   . Colon cancer Paternal Grandfather        great grand    Social History   Tobacco Use  . Smoking status: Never Smoker  . Smokeless tobacco: Never Used  Vaping Use  . Vaping Use: Never used  Substance Use Topics  . Alcohol use: Yes     Comment: occassional  . Drug use: No    Home Medications Prior to Admission medications   Medication Sig Start Date End Date Taking? Authorizing Provider  fluticasone (FLONASE) 50 MCG/ACT nasal spray PLACE 2 SPRAYS INTO BOTH NOSTRILS DAILY. 01/23/20   Midge Minium, MD  LORazepam (ATIVAN) 0.5 MG tablet Take 0.5-1 tablets (0.25-0.5 mg total) by mouth every 8 (eight) hours as needed for anxiety. Patient not taking: Reported on 12/29/2019 01/28/19   Brunetta Jeans, PA-C  metoprolol succinate (TOPROL-XL) 25 MG 24 hr tablet TAKE 1 TABLET (25 MG TOTAL) BY MOUTH DAILY. 11/03/19   Midge Minium, MD  sertraline (ZOLOFT) 50 MG tablet TAKE 1 TABLET BY MOUTH ONCE DAILY 11/03/19   Midge Minium, MD  zinc gluconate 50 MG tablet Take 50 mg by mouth daily.    [provider]    Allergies    Nsaids  Review of Systems   Review of Systems  Constitutional: Positive for activity change.  Musculoskeletal: Positive for arthralgias and joint swelling.  Skin: Negative for wound.  Hematological: Does not bruise/bleed easily.    Physical Exam Updated Vital Signs BP (!) 138/95 (BP Location: Right Arm)   Pulse 90   Temp 98.6 F (37 C) (Oral)   Resp 17  Ht 5\' 5"  (1.651 m)   Wt 65.8 kg   SpO2 100%   BMI 24.13 kg/m   Physical Exam Vitals and nursing note reviewed.  Constitutional:      Appearance: He is well-developed.  HENT:     Head: Atraumatic.  Cardiovascular:     Rate and Rhythm: Normal rate.  Pulmonary:     Effort: Pulmonary effort is normal.  Musculoskeletal:        General: Swelling and tenderness present.     Cervical back: Neck supple.     Comments: Patient has gross swelling over the right lateral ankle, around the malleoli.  No navicular tenderness.  No laxity over the patella.  Patient able to flex over the hip, and lower extremity strength is 4+ out of 5 on the right side.  Skin:    General: Skin is warm.  Neurological:     Mental Status: He is alert  and oriented to person, place, and time.     ED Results / Procedures / Treatments   Labs (all labs ordered are listed, but only abnormal results are displayed) Labs Reviewed - No data to display  EKG None  Radiology DG Knee Complete 4 Views Right  Result Date: 02/10/2020 CLINICAL DATA:  Fall with right knee pain EXAM: RIGHT KNEE - COMPLETE 4+ VIEW COMPARISON:  None. FINDINGS: Longitudinal lucency at the fibular neck favoring nutrient channel. Normal knee alignment. Negative for joint effusion. IMPRESSION: Lucency at the fibular neck favoring nutrient channel over nondisplaced fracture. Please correlate for focal pain and attention on pending leg radiograph. Electronically Signed   By: Monte Fantasia M.D.   On: 02/10/2020 09:28   DG Ankle Right Port  Result Date: 02/10/2020 CLINICAL DATA:  Fall with right ankle pain EXAM: PORTABLE RIGHT ANKLE - 2 VIEW COMPARISON:  None. FINDINGS: There is no evidence of fracture, dislocation, or joint effusion. Lateral soft tissue swelling. IMPRESSION: Soft tissue swelling without fracture. Electronically Signed   By: Monte Fantasia M.D.   On: 02/10/2020 09:26    Procedures Procedures   Medications Ordered in ED Medications  HYDROcodone-acetaminophen (NORCO/VICODIN) 5-325 MG per tablet 1 tablet (1 tablet Oral Given 02/10/20 7096)    ED Course  I have reviewed the triage vital signs and the nursing notes.  Pertinent labs & imaging results that were available during my care of the patient were reviewed by me and considered in my medical decision making (see chart for details).    MDM Rules/Calculators/A&P                          30 year old male comes in with chief complaint of fall. Based on our evaluation, it appears that he has high-grade ankle sprain versus occult fibular fracture.  Patient had difficulty bearing weight after the fall.  Besides the right leg, low suspicion for any other clinically significant injury at this time.  It is  possible that he has further soft tissue injury over the right leg and thigh.  X-rays ordered and reviewed by me.  There is no clear evidence of fibular fracture.  We will order cam walker boot and advise weightbearing as tolerated with orthopedist follow-up in 10 days.  Final Clinical Impression(s) / ED Diagnoses Final diagnoses:  Fall  Sprain of right ankle, unspecified ligament, initial encounter  Soft tissue injury    Rx / DC Orders ED Discharge Orders    None       Caidence Higashi,  MD 02/10/20 1049

## 2020-02-10 NOTE — Discharge Instructions (Signed)
We saw in the ER after you had a fall.  It appears that you likely have soft tissue injury in your right leg.  The ankle likely has a high-grade sprain, however an x-ray can miss an occult fracture.  We recommend that he follow-up with the orthopedist in about 10 days.  Until then, we recommend using the cam walker boot and weightbearing only as tolerated.  RICE therapy is also advised -minus the NSAIDs in your case.  Take 650 mg of Tylenol every 6 hours as needed for pain control.

## 2020-02-10 NOTE — ED Triage Notes (Signed)
Pt states that he slipped on black ice in parking lot this morning. Pt rates 6/10 pain in right leg from ankle to quad at this time.

## 2020-02-18 ENCOUNTER — Other Ambulatory Visit: Payer: Self-pay

## 2020-02-18 ENCOUNTER — Other Ambulatory Visit (HOSPITAL_COMMUNITY): Payer: Self-pay | Admitting: Orthopaedic Surgery

## 2020-02-18 ENCOUNTER — Ambulatory Visit (HOSPITAL_COMMUNITY)
Admission: RE | Admit: 2020-02-18 | Discharge: 2020-02-18 | Disposition: A | Discharge status: Home / Self Care | Payer: PRIVATE HEALTH INSURANCE | Source: Ambulatory Visit | Attending: Orthopaedic Surgery | Admitting: Orthopaedic Surgery

## 2020-02-18 DIAGNOSIS — M25471 Effusion, right ankle: Secondary | ICD-10-CM | POA: Diagnosis not present

## 2020-02-18 DIAGNOSIS — T148XXA Other injury of unspecified body region, initial encounter: Secondary | ICD-10-CM | POA: Diagnosis not present

## 2020-02-18 DIAGNOSIS — S93431A Sprain of tibiofibular ligament of right ankle, initial encounter: Secondary | ICD-10-CM | POA: Diagnosis not present

## 2020-02-18 DIAGNOSIS — M7989 Other specified soft tissue disorders: Secondary | ICD-10-CM | POA: Diagnosis not present

## 2020-02-18 DIAGNOSIS — R600 Localized edema: Secondary | ICD-10-CM | POA: Diagnosis not present

## 2020-02-20 ENCOUNTER — Telehealth: Payer: Self-pay | Admitting: Family Medicine

## 2020-02-20 ENCOUNTER — Other Ambulatory Visit: Payer: Self-pay | Admitting: Orthopaedic Surgery

## 2020-02-20 NOTE — Telephone Encounter (Signed)
Pt called in stating that he is going to be having surgery on his ankle on Tuesday 02/24/20, he wanted to know when should he stop the Metoprolol and the Zoloft. '  Pt can be reached at the home #

## 2020-02-21 ENCOUNTER — Other Ambulatory Visit (HOSPITAL_COMMUNITY)
Admission: RE | Admit: 2020-02-21 | Discharge: 2020-02-21 | Disposition: A | Discharge status: Home / Self Care | Payer: 59 | Source: Ambulatory Visit | Attending: Orthopaedic Surgery | Admitting: Orthopaedic Surgery

## 2020-02-21 DIAGNOSIS — Z20822 Contact with and (suspected) exposure to covid-19: Secondary | ICD-10-CM | POA: Insufficient documentation

## 2020-02-21 DIAGNOSIS — Z01812 Encounter for preprocedural laboratory examination: Secondary | ICD-10-CM | POA: Diagnosis not present

## 2020-02-21 LAB — SARS CORONAVIRUS 2 (TAT 6-24 HRS): SARS Coronavirus 2: NEGATIVE

## 2020-02-23 ENCOUNTER — Encounter (HOSPITAL_COMMUNITY): Payer: Self-pay | Admitting: Orthopaedic Surgery

## 2020-02-23 NOTE — Telephone Encounter (Signed)
Called patient to address concerns and patient understood. No further concerns at this time.

## 2020-02-23 NOTE — Anesthesia Preprocedure Evaluation (Signed)
Anesthesia Evaluation  Patient identified by MRN, date of birth, ID band Patient awake    Reviewed: Allergy & Precautions, NPO status , Patient's Chart, lab work & pertinent test results  Airway Mallampati: I  TM Distance: >3 FB Neck ROM: Full    Dental  (+) Dental Advisory Given   Pulmonary asthma ,    breath sounds clear to auscultation       Cardiovascular hypertension, Pt. on home beta blockers + dysrhythmias  Rhythm:Regular Rate:Normal     Neuro/Psych negative neurological ROS     GI/Hepatic negative GI ROS, Neg liver ROS,   Endo/Other  negative endocrine ROS  Renal/GU negative Renal ROS     Musculoskeletal   Abdominal   Peds  Hematology negative hematology ROS (+)   Anesthesia Other Findings   Reproductive/Obstetrics                            Anesthesia Physical Anesthesia Plan  ASA: II  Anesthesia Plan: General   Post-op Pain Management:  Regional for Post-op pain   Induction: Intravenous  PONV Risk Score and Plan: 2 and Dexamethasone, Ondansetron and Treatment may vary due to age or medical condition  Airway Management Planned: LMA  Additional Equipment:   Intra-op Plan:   Post-operative Plan: Extubation in OR  Informed Consent: I have reviewed the patients History and Physical, chart, labs and discussed the procedure including the risks, benefits and alternatives for the proposed anesthesia with the patient or authorized representative who has indicated his/her understanding and acceptance.     Dental advisory given  Plan Discussed with: CRNA  Anesthesia Plan Comments:        Anesthesia Quick Evaluation

## 2020-02-23 NOTE — Telephone Encounter (Signed)
Patient is having surgery on his ankle tomorrow and was wanting to know when he should stop taking the metoprolol and the zoloft?

## 2020-02-23 NOTE — Progress Notes (Signed)
EKG:  DOS CXR:  N/A ECHO:  Denies Stress Test:  Denies Cardiac Cath:  Denies  Fasting Blood Sugar-  N/A Checks Blood Sugar__N/A_ times a day  OSA/CPAP:  No  ASA/Blood Thinners:  No  Covid test 02/21/20 negative  Anesthesia Review:  No  Patient denies shortness of breath, fever, cough, and chest pain at PAT appointment.  Patient verbalized understanding of instructions provided today at the PAT appointment.  Patient asked to review instructions at home and day of surgery.

## 2020-02-23 NOTE — Telephone Encounter (Signed)
No need to stop either one.  He can skip tomorrow morning due to anesthesia and being NPO but he can resume later that day or the next day

## 2020-02-24 ENCOUNTER — Ambulatory Visit (HOSPITAL_COMMUNITY)
Admission: RE | Admit: 2020-02-24 | Discharge: 2020-02-24 | Disposition: A | Discharge status: Home / Self Care | Payer: PRIVATE HEALTH INSURANCE | Attending: Orthopaedic Surgery | Admitting: Orthopaedic Surgery

## 2020-02-24 ENCOUNTER — Encounter (HOSPITAL_COMMUNITY)
Admission: RE | Disposition: A | Discharge status: Home / Self Care | Payer: Self-pay | Source: Home / Self Care | Attending: Orthopaedic Surgery

## 2020-02-24 ENCOUNTER — Other Ambulatory Visit (HOSPITAL_BASED_OUTPATIENT_CLINIC_OR_DEPARTMENT_OTHER): Payer: Self-pay | Admitting: Orthopaedic Surgery

## 2020-02-24 ENCOUNTER — Ambulatory Visit (HOSPITAL_COMMUNITY): Payer: PRIVATE HEALTH INSURANCE | Admitting: Anesthesiology

## 2020-02-24 ENCOUNTER — Encounter (HOSPITAL_COMMUNITY): Payer: Self-pay | Admitting: Orthopaedic Surgery

## 2020-02-24 ENCOUNTER — Ambulatory Visit (HOSPITAL_COMMUNITY): Payer: PRIVATE HEALTH INSURANCE

## 2020-02-24 ENCOUNTER — Other Ambulatory Visit: Payer: Self-pay

## 2020-02-24 DIAGNOSIS — M65871 Other synovitis and tenosynovitis, right ankle and foot: Secondary | ICD-10-CM | POA: Diagnosis not present

## 2020-02-24 DIAGNOSIS — S82301A Unspecified fracture of lower end of right tibia, initial encounter for closed fracture: Secondary | ICD-10-CM | POA: Diagnosis not present

## 2020-02-24 DIAGNOSIS — M25871 Other specified joint disorders, right ankle and foot: Secondary | ICD-10-CM | POA: Diagnosis present

## 2020-02-24 DIAGNOSIS — W000XXA Fall on same level due to ice and snow, initial encounter: Secondary | ICD-10-CM | POA: Insufficient documentation

## 2020-02-24 DIAGNOSIS — Z886 Allergy status to analgesic agent status: Secondary | ICD-10-CM | POA: Diagnosis not present

## 2020-02-24 DIAGNOSIS — S93431A Sprain of tibiofibular ligament of right ankle, initial encounter: Secondary | ICD-10-CM | POA: Diagnosis not present

## 2020-02-24 DIAGNOSIS — Z79899 Other long term (current) drug therapy: Secondary | ICD-10-CM | POA: Insufficient documentation

## 2020-02-24 HISTORY — PX: ANKLE ARTHROSCOPY WITH OPEN REDUCTION INTERNAL FIXATION (ORIF): SHX5582

## 2020-02-24 LAB — BASIC METABOLIC PANEL
Anion gap: 9 (ref 5–15)
BUN: 10 mg/dL (ref 6–20)
CO2: 27 mmol/L (ref 22–32)
Calcium: 9.5 mg/dL (ref 8.9–10.3)
Chloride: 104 mmol/L (ref 98–111)
Creatinine, Ser: 0.94 mg/dL (ref 0.61–1.24)
GFR, Estimated: >60 mL/min (ref >60)
Glucose, Bld: 107 mg/dL — ABNORMAL HIGH (ref 70–99)
Potassium: 3.7 mmol/L (ref 3.5–5.1)
Sodium: 140 mmol/L (ref 135–145)

## 2020-02-24 LAB — SURGICAL PCR SCREEN
MRSA, PCR: NEGATIVE
Staphylococcus aureus: NEGATIVE

## 2020-02-24 SURGERY — ANKLE ARTHROSCOPY WITH OPEN REDUCTION INTERNAL FIXATION (ORIF)
Anesthesia: General | Site: Ankle | Laterality: Right

## 2020-02-24 MED ORDER — FENTANYL CITRATE (PF) 100 MCG/2ML IJ SOLN
INTRAMUSCULAR | Status: DC | PRN
  Administered 2020-02-24: 100 ug via INTRAVENOUS
  Administered 2020-02-24 (×3): 50 ug via INTRAVENOUS

## 2020-02-24 MED ORDER — PROPOFOL 10 MG/ML IV BOLUS
INTRAVENOUS | Status: AC
  Filled 2020-02-24: qty 40

## 2020-02-24 MED ORDER — PROPOFOL 10 MG/ML IV BOLUS
INTRAVENOUS | Status: DC | PRN
Start: 2020-02-24 — End: 2020-02-24
  Administered 2020-02-24: 200 mg via INTRAVENOUS

## 2020-02-24 MED ORDER — SODIUM CHLORIDE (PF) 0.9 % IJ SOLN
INTRAMUSCULAR | Status: DC | PRN
  Administered 2020-02-24: 50 mL via INTRAVENOUS

## 2020-02-24 MED ORDER — ROPIVACAINE HCL 5 MG/ML IJ SOLN
INTRAMUSCULAR | Status: DC | PRN
  Administered 2020-02-24: 20 mL via PERINEURAL

## 2020-02-24 MED ORDER — FENTANYL CITRATE (PF) 100 MCG/2ML IJ SOLN: 25.0000 ug | INTRAMUSCULAR | Status: DC | PRN

## 2020-02-24 MED ORDER — 0.9 % SODIUM CHLORIDE (POUR BTL) OPTIME
TOPICAL | Status: DC | PRN
  Administered 2020-02-24: 1000 mL

## 2020-02-24 MED ORDER — CEFAZOLIN SODIUM-DEXTROSE 2-4 GM/100ML-% IV SOLN
INTRAVENOUS | Status: AC
  Filled 2020-02-24: qty 100

## 2020-02-24 MED ORDER — ACETAMINOPHEN 500 MG PO TABS
ORAL_TABLET | ORAL | Status: AC
  Administered 2020-02-24: 1000 mg via ORAL
  Filled 2020-02-24: qty 2

## 2020-02-24 MED ORDER — SODIUM CHLORIDE 0.9 % IR SOLN
Status: DC | PRN
  Administered 2020-02-24 (×2): 3000 mL

## 2020-02-24 MED ORDER — LIDOCAINE 2% (20 MG/ML) 5 ML SYRINGE
INTRAMUSCULAR | Status: DC | PRN
  Administered 2020-02-24: 10 mg via INTRAVENOUS

## 2020-02-24 MED ORDER — MIDAZOLAM HCL 5 MG/5ML IJ SOLN
INTRAMUSCULAR | Status: DC | PRN
  Administered 2020-02-24: 2 mg via INTRAVENOUS

## 2020-02-24 MED ORDER — BUPIVACAINE HCL (PF) 0.5 % IJ SOLN
INTRAMUSCULAR | Status: AC
  Filled 2020-02-24: qty 30

## 2020-02-24 MED ORDER — MIDAZOLAM HCL 2 MG/2ML IJ SOLN
INTRAMUSCULAR | Status: AC
  Filled 2020-02-24: qty 2

## 2020-02-24 MED ORDER — DEXAMETHASONE SODIUM PHOSPHATE 4 MG/ML IJ SOLN
INTRAMUSCULAR | Status: DC | PRN
  Administered 2020-02-24: 10 mg via INTRAVENOUS

## 2020-02-24 MED ORDER — LACTATED RINGERS IV SOLN: INTRAVENOUS | Status: DC

## 2020-02-24 MED ORDER — CEFAZOLIN SODIUM-DEXTROSE 2-4 GM/100ML-% IV SOLN
2.0000 g | INTRAVENOUS | Status: AC
  Administered 2020-02-24: 2 g via INTRAVENOUS

## 2020-02-24 MED ORDER — AMISULPRIDE (ANTIEMETIC) 5 MG/2ML IV SOLN: 10.0000 mg | Freq: Once | INTRAVENOUS | Status: DC | PRN

## 2020-02-24 MED ORDER — FENTANYL CITRATE (PF) 250 MCG/5ML IJ SOLN
INTRAMUSCULAR | Status: AC
  Filled 2020-02-24: qty 5

## 2020-02-24 MED ORDER — ONDANSETRON HCL 4 MG/2ML IJ SOLN
INTRAMUSCULAR | Status: DC | PRN
  Administered 2020-02-24: 4 mg via INTRAVENOUS

## 2020-02-24 MED ORDER — CHLORHEXIDINE GLUCONATE 0.12 % MT SOLN: 15.0000 mL | Freq: Once | OROMUCOSAL | Status: AC

## 2020-02-24 MED ORDER — ASPIRIN 325 MG PO TABS: 325.0000 mg | ORAL_TABLET | Freq: Every day | ORAL | 11 refills | Status: DC

## 2020-02-24 MED ORDER — OXYCODONE HCL 5 MG PO TABS: 5.0000 mg | ORAL_TABLET | ORAL | 0 refills | Status: DC | PRN

## 2020-02-24 MED ORDER — ACETAMINOPHEN 500 MG PO TABS: 1000.0000 mg | ORAL_TABLET | Freq: Once | ORAL | Status: AC

## 2020-02-24 MED ORDER — EPHEDRINE SULFATE-NACL 50-0.9 MG/10ML-% IV SOSY
PREFILLED_SYRINGE | INTRAVENOUS | Status: DC | PRN
  Administered 2020-02-24: 10 mg via INTRAVENOUS
  Administered 2020-02-24 (×2): 5 mg via INTRAVENOUS
  Administered 2020-02-24: 10 mg via INTRAVENOUS

## 2020-02-24 MED ORDER — BUPIVACAINE-EPINEPHRINE (PF) 0.5% -1:200000 IJ SOLN
INTRAMUSCULAR | Status: DC | PRN
Start: 2020-02-24 — End: 2020-02-24
  Administered 2020-02-24: 25 mL via PERINEURAL

## 2020-02-24 MED ORDER — CHLORHEXIDINE GLUCONATE 0.12 % MT SOLN
OROMUCOSAL | Status: AC
  Administered 2020-02-24: 15 mL via OROMUCOSAL
  Filled 2020-02-24: qty 15

## 2020-02-24 MED ORDER — DEXMEDETOMIDINE (PRECEDEX) IN NS 20 MCG/5ML (4 MCG/ML) IV SYRINGE
PREFILLED_SYRINGE | INTRAVENOUS | Status: DC | PRN
  Administered 2020-02-24: 8 ug via INTRAVENOUS
  Administered 2020-02-24: 4 ug via INTRAVENOUS
  Administered 2020-02-24: 8 ug via INTRAVENOUS

## 2020-02-24 MED ORDER — ORAL CARE MOUTH RINSE: 15.0000 mL | Freq: Once | OROMUCOSAL | Status: AC

## 2020-02-24 MED ORDER — CLONIDINE HCL (ANALGESIA) 100 MCG/ML EP SOLN
EPIDURAL | Status: DC | PRN
  Administered 2020-02-24 (×2): 50 ug

## 2020-02-24 MED FILL — ASPIRIN EC 325 MG TABLET: 325 | 100 days supply | Qty: 100 | Fill #0

## 2020-02-24 MED FILL — oxyCODONE HCL 5 MG TABS: 5 | 6 days supply | Qty: 32 | Fill #0

## 2020-02-24 SURGICAL SUPPLY — 39 items
BIT DRILL 2.5 CANN LNG (BIT) ×2 IMPLANT
BNDG ELASTIC 6X10 VLCR STRL LF (GAUZE/BANDAGES/DRESSINGS) ×2 IMPLANT
CHLORAPREP W/TINT 26 (MISCELLANEOUS) ×2 IMPLANT
CUFF TOURN SGL QUICK 34 (TOURNIQUET CUFF) ×1
CUFF TRNQT CYL 34X4.125X (TOURNIQUET CUFF) ×1 IMPLANT
DRAPE ARTHROSCOPY W/POUCH 114 (DRAPES) ×2 IMPLANT
DRAPE OEC MINIVIEW 54X84 (DRAPES) ×2 IMPLANT
DRAPE U-SHAPE 47X51 STRL (DRAPES) ×2 IMPLANT
DRSG XEROFORM 1X8 (GAUZE/BANDAGES/DRESSINGS) ×4 IMPLANT
ELECT REM PT RETURN 9FT ADLT (ELECTROSURGICAL) ×2
ELECTRODE REM PT RTRN 9FT ADLT (ELECTROSURGICAL) ×1 IMPLANT
EXCALIBUR 3.8MM X 13CM (MISCELLANEOUS) ×2 IMPLANT
GAUZE SPONGE 4X4 12PLY STRL (GAUZE/BANDAGES/DRESSINGS) ×2 IMPLANT
GAUZE SPONGE 4X4 12PLY STRL LF (GAUZE/BANDAGES/DRESSINGS) ×2 IMPLANT
GAUZE XEROFORM 1X8 LF (GAUZE/BANDAGES/DRESSINGS) ×2 IMPLANT
GLOVE BIOGEL M STRL SZ7.5 (GLOVE) ×2 IMPLANT
GLOVE SRG 8 PF TXTR STRL LF DI (GLOVE) ×1 IMPLANT
GLOVE SURG UNDER POLY LF SZ8 (GLOVE) ×1
GOWN STRL REUS W/ TWL LRG LVL3 (GOWN DISPOSABLE) ×1 IMPLANT
GOWN STRL REUS W/ TWL XL LVL3 (GOWN DISPOSABLE) ×1 IMPLANT
GOWN STRL REUS W/TWL LRG LVL3 (GOWN DISPOSABLE) ×1
GOWN STRL REUS W/TWL XL LVL3 (GOWN DISPOSABLE) ×1
KIT BASIN OR (CUSTOM PROCEDURE TRAY) ×2 IMPLANT
NEEDLE 18GX1X1/2 (RX/OR ONLY) (NEEDLE) ×2 IMPLANT
NS IRRIG 1000ML POUR BTL (IV SOLUTION) ×2 IMPLANT
PACK ORTHO EXTREMITY (CUSTOM PROCEDURE TRAY) ×2 IMPLANT
PAD CAST 4YDX4 CTTN HI CHSV (CAST SUPPLIES) ×1 IMPLANT
PADDING CAST COTTON 4X4 STRL (CAST SUPPLIES) ×1
PLATE LOCK THIRD TUBULAR 4H (Plate) ×2 IMPLANT
SCREW CORT FT LP 3.5X10 (Screw) ×2 IMPLANT
SCREW LP TI 3.5X14MM (Screw) ×2 IMPLANT
STRAP ANKLE FOOT DISTRACTOR (ORTHOPEDIC SUPPLIES) ×2 IMPLANT
SUT ETHILON 3 0 PS 1 (SUTURE) ×4 IMPLANT
SUT MNCRL AB 3-0 PS2 18 (SUTURE) ×2 IMPLANT
SYNDESMOSIS TIGHTROPE XP (Orthopedic Implant) ×4 IMPLANT
SYR 20ML LL LF (SYRINGE) ×2 IMPLANT
TUBE CONNECTING 20X1/4 (TUBING) ×2 IMPLANT
TUBING ARTHROSCOPY IRRIG 16FT (MISCELLANEOUS) ×2 IMPLANT
UNDERPAD 30X36 HEAVY ABSORB (UNDERPADS AND DIAPERS) ×2 IMPLANT

## 2020-02-24 NOTE — Transfer of Care (Signed)
Immediate Anesthesia Transfer of Care Note  Patient: Miguel Love  Procedure(s) Performed: RIGHT ANKLE ARTHROSCOPIC DEBRIDEMENT, OPEN TREATMENT OF SYNDESMOSIS (Right Ankle)  Patient Location: PACU  Anesthesia Type:General  Level of Consciousness: awake, alert  and oriented  Airway & Oxygen Therapy: Patient Spontanous Breathing and Patient connected to face mask oxygen  Post-op Assessment: Report given to RN and Post -op Vital signs reviewed and stable  Post vital signs: Reviewed and stable  Last Vitals:  Vitals Value Taken Time  BP 112/58 02/24/20 0920  Temp    Pulse 68 02/24/20 0921  Resp 16 02/24/20 0921  SpO2 99 % 02/24/20 0921  Vitals shown include unvalidated device data.  Last Pain:  Vitals:   02/24/20 0702  TempSrc:   PainSc: 3       Patients Stated Pain Goal: 1 (14/97/02 6378)  Complications: No complications documented.

## 2020-02-24 NOTE — Anesthesia Procedure Notes (Signed)
Anesthesia Regional Block: Popliteal block   Pre-Anesthetic Checklist: ,, timeout performed, Correct Patient, Correct Site, Correct Laterality, Correct Procedure, Correct Position, site marked, Risks and benefits discussed,  Surgical consent,  Pre-op evaluation,  At surgeon's request and post-op pain management  Laterality: Right  Prep: chloraprep       Needles:  Injection technique: Single-shot  Needle Type: Echogenic Needle     Needle Length: 9cm  Needle Gauge: 21     Additional Needles:   Procedures:,,,, ultrasound used (permanent image in chart),,,,  Narrative:  Start time: 02/24/2020 7:07 AM End time: 02/24/2020 7:13 AM Injection made incrementally with aspirations every 5 mL.  Performed by: Personally  Anesthesiologist: Suzette Battiest, MD

## 2020-02-24 NOTE — Anesthesia Procedure Notes (Signed)
Procedure Name: LMA Insertion Date/Time: 02/24/2020 7:41 AM Performed by: Griffin Dakin, CRNA Pre-anesthesia Checklist: Patient identified, Emergency Drugs available, Suction available, Patient being monitored and Timeout performed Patient Re-evaluated:Patient Re-evaluated prior to induction Oxygen Delivery Method: Circle system utilized Preoxygenation: Pre-oxygenation with 100% oxygen Induction Type: IV induction Ventilation: Mask ventilation without difficulty LMA: LMA inserted LMA Size: 5.0 Placement Confirmation: positive ETCO2 and breath sounds checked- equal and bilateral Tube secured with: Tape Dental Injury: Teeth and Oropharynx as per pre-operative assessment

## 2020-02-24 NOTE — Op Note (Signed)
Miguel Love male 30 y.o. 02/24/2020  PreOperative Diagnosis: Right ankle impingement Syndesmosis disruption  PostOperative Diagnosis: same  PROCEDURE: Right ankle arthroscopic debridement, extensive Open treatment of right syndesmosis with internal fixation  SURGEON: Melony Overly, MD  ASSISTANT: None  ANESTHESIA: General LMA with popliteal nerve block by anesthesia  FINDINGS: No obvious cartilage damage.  There was evidence of exposed bone on the anteromedial and anterolateral aspect of the distal anterior tibial plafond.  Disruption of the syndesmotic ligaments with syndesmosis instability  IMPLANTS: Arthrex tight rope with a 4 hole one third tubular plate  [id number]:30 y.o. male slipped on the ice and sustained the above injury.  This occurred approximately 1 week ago.  He was seen in the office where there was pain over the syndesmosis.  MRI was ordered which revealed disruption of the syndesmotic ligaments.  He was indicated for surgery due to this.   Patient understood the risks, benefits and alternatives to surgery which include but are not limited to wound healing complications, infection, nonunion, malunion, need for further surgery as well as damage to surrounding structures. They also understood the potential for continued pain in that there were no guarantees of acceptable outcome After weighing these risks the patient opted to proceed with surgery.  PROCEDURE: Patient was identified in the preoperative holding area.  The right leg was marked by myself.  Consent was signed by myself and the patient.  Block was performed by anesthesia in the preoperative holding area.  Patient was taken to the operative suite and placed supine on the operative table.  General LMA anesthesia was induced without difficulty. Bump was placed under the operative hip.  Preoperative antibiotics were given.  The right lower extremity was prepped and draped in usual sterile fashion.  The  ankle distractor was placed.  Tourniquet was elevated.  Timeout was performed.  We began by making an anteromedial portal to the ankle joint.  This was done with an 11 blade through the skin.  Then blunt dissection was used with a hemostat down to the capsule.  Then the capsule was violated and the joint entered.  Then the trocar with the camera was placed.  There was some synovitis within the ankle joint.   Then a lateral portal was placed in a similar fashion.  The probe was placed to remove the synovitic tissue and the joint surfaces were inspected.    Upon inspection of the joint there was found to be without evidence of cartilage wear or chondrosis.  Then the probe was removed and the shaver was inserted.  Extensive debridement was done of the ankle joint including synovial tissue and evidence of chondral impingement on the anterior distal tibial plafond and bone.    The joint surfaces were inspected for any evidence of loose cartilage.  There was none. The ankle joint was debrided extensively.   We then turned our attention to the syndesmosis.  We were able to visualize the syndesmosis through the scope.  The scope was placed in the lateral portal and using a probe we are able to identify the torn ligaments.  We are able to place this probe through the AITF L ligament and within to the syndesmosis.  There is instability noted there.  We then debrided portion of the AITF L with the shaver.  The shaver was removed.  This completed the arthroscopic portion of the case.  We then turned our attention to the syndesmosis laterally.  An incision was made overlying the distal fibula.  This taken sharply through skin and subcutaneous tissue.  Skin bleeders were Bovie cauterized and flaps were created anteriorly and posteriorly.  No branches of the superficial peroneal nerve were identified.  Then the incision was carried further anteriorly below the level of the peroneus tertius muscle belly and the syndesmosis was  identified.  The ligaments were torn there is a small avulsion fracture off the anterolateral distal tibia at the site of the incisura.  Rondure was used to clean out hematoma from the syndesmosis and torn ligament.  Valora Corporal was placed within the syndesmosis was found to be grossly stable.  Then a 4-hole plate was positioned on the fibula in the appropriate position which was confirmed on fluoroscopy.  2 unicortical screws were placed proximally and distally within the plate to hold it in place.  Then the appropriate location for the syndesmotic fixation was identified.  Then a Weber clamp was placed across the syndesmosis and it was reduced under direct visualization of the syndesmosis anteriorly.  Then the drill was used to make the drill tunnel for the tight rope placement.  Tight rope was then placed without difficulty.  A small incision was made medially at the site of the tight rope button to ensure appropriate placement.  This was maneuvered into place.  It was tensioned.  The Weber clamp was removed.  Under fluoroscopy the ankle was further stressed and found to be stable with regard to syndesmotic widening and medial clear space widening.  The wound was then irrigated copiously with normal saline.  The wound was closed in a layered fashion using 3-0 Monocryl and 3-0 nylon.      The leg was cleaned and the wounds were covered with Xeroform and a soft dressing.   A nonweightbearing short leg splint was placed.  They were awakened from anesthesia and taken recovery in stable condition.  All counts were correct at the end the case.  There was no complications.   POST OPERATIVE INSTRUCTIONS: Keep splint dry and in place Nonweightbearing to right lower extremity Call the office with concerns Follow-up in 2 weeks for splint removal, suture removal and likely placement of a nonweightbearing cast. No need for DVT prophylaxis.  TOURNIQUET TIME: Less than 2 hours  BLOOD LOSS:  Minimal         DRAINS:  none         SPECIMEN: none       COMPLICATIONS:  * No complications entered in OR log *         Disposition: PACU - hemodynamically stable.         Condition: stable

## 2020-02-24 NOTE — Anesthesia Postprocedure Evaluation (Signed)
Anesthesia Post Note  Patient: KALAI BACA  Procedure(s) Performed: RIGHT ANKLE ARTHROSCOPIC DEBRIDEMENT, OPEN TREATMENT OF SYNDESMOSIS (Right Ankle)     Patient location during evaluation: PACU Anesthesia Type: General Level of consciousness: awake and alert Pain management: pain level controlled Vital Signs Assessment: post-procedure vital signs reviewed and stable Respiratory status: spontaneous breathing, nonlabored ventilation, respiratory function stable and patient connected to nasal cannula oxygen Cardiovascular status: blood pressure returned to baseline and stable Postop Assessment: no apparent nausea or vomiting Anesthetic complications: no   No complications documented.  Last Vitals:  Vitals:   02/24/20 0950 02/24/20 1005  BP: 133/67 135/66  Pulse: 87 84  Resp: 13 17  Temp:  36.6 C  SpO2: 98% 98%    Last Pain:  Vitals:   02/24/20 1005  TempSrc:   PainSc: 0-No pain                 Tiajuana Amass

## 2020-02-24 NOTE — Discharge Instructions (Signed)
DR. Narmeen Kerper FOOT [facility] ANKLE SURGERY POST-OP INSTRUCTIONS   Pain Management 1. The numbing medicine and your leg will last around 18 hours, take a dose of your pain medicine as soon as you feel it wearing off to avoid rebound pain. 2. Keep your foot elevated above heart level.  Make sure that your heel hangs free ('floats'). 3. Take all prescribed medication as directed. 4. If taking narcotic pain medication you may want to use an over-the-counter stool softener to avoid constipation. 5. You may take over-the-counter NSAIDs (ibuprofen, naproxen, etc.) as well as over-the-counter acetaminophen as directed on the packaging as a supplement for your pain and may also use it to wean away from the prescription medication.  Activity ? Non-weightbearing ? Keep splint intact  First Postoperative Visit 1. Your first postop visit will be at least 2 weeks after surgery.  This should be scheduled when you schedule surgery. 2. If you do not have a postoperative visit scheduled please call [phone] to schedule an appointment. 3. At the appointment your incision will be evaluated for suture removal, x-rays will be obtained if necessary.  General Instructions 1. Swelling is very common after foot and ankle surgery.  It often takes 3 months for the foot and ankle to begin to feel comfortable.  Some amount of swelling will persist for 6-12 months. 2. DO NOT change the dressing.  If there is a problem with the dressing (too tight, loose, gets wet, etc.) please contact Dr. Remmie Bembenek's office. 3. DO NOT get the dressing wet.  For showers you can use an over-the-counter cast cover or wrap a washcloth around the top of your dressing and then cover it with a plastic bag and tape it to your leg. 4. DO NOT soak the incision (no tubs, pools, bath, etc.) until you have approval from Dr. Son Barkan.  Contact Dr. Adairs office or go to Emergency Room if: 1. Temperature above 101 F. 2. Increasing pain that is unresponsive to pain  medication or elevation 3. Excessive redness or swelling in your foot 4. Dressing problems - excessive bloody drainage, looseness or tightness, or if dressing gets wet 5. Develop pain, swelling, warmth, or discoloration of your calf  

## 2020-02-24 NOTE — H&P (Signed)
PREOPERATIVE H&P  Chief Complaint: Right ankle pain  HPI: Miguel Love is a 30 y.o. male who presents for preoperative history and physical with a diagnosis of right syndesmotic disruption and proximal fibula fracture consistent with Maisonneuve type injury. Patient slipped on the ice approximately 1.5 weeks ago. He had pain in his ankle. He was seen at an outside facility and given a boot. On my evaluation he had pain in the ankle and pain with syndesmotic stress as well as pain along the proximal fibula. X-rays revealed a proximal fibula fracture. MRI scan revealed disruption of the syndesmotic ligaments. He is here today for surgical intervention. Symptoms are rated as moderate to severe, and have been worsening.  This is significantly impairing activities of daily living.  He has elected for surgical management.   Past Medical History:  Diagnosis Date  . Anxiety   . Asthma   . Cardiac arrhythmia due to congenital heart disease    pt denies 12/25/12  . Gallbladder polyp   . Hypertension   . Palpitations    Past Surgical History:  Procedure Laterality Date  . WISDOM TOOTH EXTRACTION  2016   Social History   Socioeconomic History  . Marital status: Single    Spouse name: Not on file  . Number of children: 0  . Years of education: Not on file  . Highest education level: Not on file  Occupational History    Employer: [employer]  Tobacco Use  . Smoking status: Never Smoker  . Smokeless tobacco: Never Used  Vaping Use  . Vaping Use: Never used  Substance and Sexual Activity  . Alcohol use: Yes    Comment: occassional  . Drug use: No  . Sexual activity: Yes  Other Topics Concern  . Not on file  Social History Narrative  . Not on file   Social Determinants of Health   Financial Resource Strain: Not on file  Food Insecurity: Not on file  Transportation Needs: Not on file  Physical Activity: Not on file  Stress: Not on file  Social Connections: Not on file   Family  History  Problem Relation Age of Onset  . Hypertension Father   . Colon polyps Father   . Lung cancer Maternal Grandfather   . Hypertension Paternal Grandmother   . Stroke Paternal Grandmother   . Hypertension Paternal Grandfather   . Colon cancer Paternal Grandfather        great grand   Allergies  Allergen Reactions  . Nsaids Shortness Of Breath    Asthma attack   Prior to Admission medications   Medication Sig Start Date End Date Taking? Authorizing Provider  fluticasone (FLONASE) 50 MCG/ACT nasal spray PLACE 2 SPRAYS INTO BOTH NOSTRILS DAILY. 01/23/20  Yes Midge Minium, MD  metoprolol succinate (TOPROL-XL) 25 MG 24 hr tablet TAKE 1 TABLET (25 MG TOTAL) BY MOUTH DAILY. 11/03/19  Yes Midge Minium, MD  sertraline (ZOLOFT) 50 MG tablet TAKE 1 TABLET BY MOUTH ONCE DAILY Patient taking differently: Take 50 mg by mouth daily. 11/03/19  Yes Midge Minium, MD  zinc gluconate 50 MG tablet Take 50 mg by mouth daily.   Yes [provider]  LORazepam (ATIVAN) 0.5 MG tablet Take 0.5-1 tablets (0.25-0.5 mg total) by mouth every 8 (eight) hours as needed for anxiety. Patient not taking: No sig reported 01/28/19   Brunetta Jeans, PA-C     Positive ROS: All other systems have been reviewed and were otherwise negative with  the exception of those mentioned in the HPI and as above.  Physical Exam:  Vitals:   02/24/20 0549  BP: (!) 145/89  Pulse: 87  Resp: 18  Temp: (!) 97.5 F (36.4 C)  SpO2: 99%   General: Alert, no acute distress Cardiovascular: No pedal edema Respiratory: No cyanosis, no use of accessory musculature GI: No organomegaly, abdomen is soft and non-tender Skin: No lesions in the area of chief complaint Neurologic: Sensation intact distally Psychiatric: Patient is competent for consent with normal mood and affect Lymphatic: No axillary or cervical lymphadenopathy  MUSCULOSKELETAL: Right ankle demonstrates swelling anteriorly and laterally.  He has tenderness palpation of the syndesmosis and about the anterior ankle joint. Also has tenderness palpation at the proximal fibula. No skin lacerations. Active ankle dorsiflexion plantarflexion intact albeit with discomfort. Foot is warm and well-perfused distally.  Assessment: Right syndesmotic disruption and proximal fibula fracture consistent with Maisonneuve type injury.   Plan: Plan for ankle arthroscopy for debridement and assessment of stability. Then likely open reduction internal fixation of the syndesmosis.  We discussed the risks, benefits and alternatives of surgery which include but are not limited to wound healing complications, infection, nonunion, malunion, need for further surgery, damage to surrounding structures and continued pain.  They understand there is no guarantees to an acceptable outcome.  After weighing these risks they opted to proceed with surgery.     Erle Crocker, MD    02/24/2020 7:19 AM

## 2020-02-24 NOTE — Brief Op Note (Signed)
02/24/2020  9:26 AM  PATIENT:  Miguel Love  30 y.o. male  PRE-OPERATIVE DIAGNOSIS:  RIGHT ANKLE SYNDESMOTIC DISRUPTION  POST-OPERATIVE DIAGNOSIS:  RIGHT ANKLE SYNDESMOTIC DISRUPTION  PROCEDURE:  Procedure(s) with comments: RIGHT ANKLE ARTHROSCOPIC DEBRIDEMENT, OPEN TREATMENT OF SYNDESMOSIS (Right) - LENGTH OF SURGERY: 2 HOURS  SURGEON:  Surgeon(s) and Role:    * Erle Crocker, MD - Primary  PHYSICIAN ASSISTANT:   ASSISTANTS: none   ANESTHESIA:   general, popliteal block  EBL:  Minimal   BLOOD ADMINISTERED:none  DRAINS: none   LOCAL MEDICATIONS USED:  NONE  SPECIMEN:  No Specimen  DISPOSITION OF SPECIMEN:  N/A  COUNTS:  YES  TOURNIQUET:  * Missing tourniquet times found for documented tourniquets in log: [id number] *  DICTATION: .Viviann Spare Dictation  PLAN OF CARE: Discharge to home after PACU  PATIENT DISPOSITION:  PACU - hemodynamically stable.   Delay start of Pharmacological VTE agent (>24hrs) due to surgical blood loss or risk of bleeding: no

## 2020-02-24 NOTE — Anesthesia Procedure Notes (Signed)
Anesthesia Regional Block: Adductor canal block   Pre-Anesthetic Checklist: ,, timeout performed, Correct Patient, Correct Site, Correct Laterality, Correct Procedure, Correct Position, site marked, Risks and benefits discussed,  Surgical consent,  Pre-op evaluation,  At surgeon's request and post-op pain management  Laterality: Right  Prep: chloraprep       Needles:  Injection technique: Single-shot  Needle Type: Echogenic Needle     Needle Length: 9cm  Needle Gauge: 21     Additional Needles:   Procedures:,,,, ultrasound used (permanent image in chart),,,,  Narrative:  Start time: 02/24/2020 7:13 AM End time: 02/24/2020 7:18 AM Injection made incrementally with aspirations every 5 mL.  Performed by: Personally  Anesthesiologist: Suzette Battiest, MD

## 2020-02-25 ENCOUNTER — Encounter (HOSPITAL_COMMUNITY): Payer: Self-pay | Admitting: Orthopaedic Surgery

## 2020-03-08 ENCOUNTER — Other Ambulatory Visit (HOSPITAL_BASED_OUTPATIENT_CLINIC_OR_DEPARTMENT_OTHER): Payer: Self-pay | Admitting: Orthopaedic Surgery

## 2020-03-08 MED FILL — HYDROCODON-APAP 5-325: 5-325 | 5 days supply | Qty: 32 | Fill #0

## 2020-03-17 MED FILL — SERTRALINE HCL 50 MG TABS: 50 | 30 days supply | Qty: 30 | Fill #1

## 2020-03-25 ENCOUNTER — Encounter: Payer: Self-pay | Admitting: Family Medicine

## 2020-03-25 MED ORDER — LORATADINE 10 MG PO TABS: 10.0000 mg | ORAL_TABLET | Freq: Every day | ORAL | 3 refills | Status: DC

## 2020-04-22 ENCOUNTER — Other Ambulatory Visit: Payer: Self-pay | Admitting: Family Medicine

## 2020-04-22 ENCOUNTER — Other Ambulatory Visit (HOSPITAL_BASED_OUTPATIENT_CLINIC_OR_DEPARTMENT_OTHER): Payer: Self-pay

## 2020-04-22 DIAGNOSIS — F419 Anxiety disorder, unspecified: Secondary | ICD-10-CM

## 2020-04-22 MED ORDER — SERTRALINE HCL 50 MG PO TABS
ORAL_TABLET | Freq: Every day | ORAL | 1 refills | Status: DC
  Filled 2020-04-22: qty 30, 30d supply, fill #0
  Filled 2020-05-19: qty 30, 30d supply, fill #1

## 2020-04-30 ENCOUNTER — Other Ambulatory Visit: Payer: Self-pay | Admitting: Family Medicine

## 2020-04-30 ENCOUNTER — Other Ambulatory Visit (HOSPITAL_BASED_OUTPATIENT_CLINIC_OR_DEPARTMENT_OTHER): Payer: Self-pay

## 2020-04-30 MED ORDER — METOPROLOL SUCCINATE ER 25 MG PO TB24
ORAL_TABLET | Freq: Every day | ORAL | 1 refills | Status: DC
  Filled 2020-04-30: qty 90, 90d supply, fill #0
  Filled 2020-08-18: qty 90, 90d supply, fill #1

## 2020-04-30 MED FILL — Fluticasone Propionate Nasal Susp 50 MCG/ACT: NASAL | 30 days supply | Qty: 16 | Fill #0 | Status: AC

## 2020-05-19 ENCOUNTER — Other Ambulatory Visit (HOSPITAL_BASED_OUTPATIENT_CLINIC_OR_DEPARTMENT_OTHER): Payer: Self-pay

## 2020-06-03 DIAGNOSIS — M5442 Lumbago with sciatica, left side: Secondary | ICD-10-CM | POA: Diagnosis not present

## 2020-06-03 DIAGNOSIS — M9904 Segmental and somatic dysfunction of sacral region: Secondary | ICD-10-CM | POA: Diagnosis not present

## 2020-06-03 DIAGNOSIS — M40202 Unspecified kyphosis, cervical region: Secondary | ICD-10-CM | POA: Diagnosis not present

## 2020-06-03 DIAGNOSIS — M9902 Segmental and somatic dysfunction of thoracic region: Secondary | ICD-10-CM | POA: Diagnosis not present

## 2020-06-03 DIAGNOSIS — M5137 Other intervertebral disc degeneration, lumbosacral region: Secondary | ICD-10-CM | POA: Diagnosis not present

## 2020-06-03 DIAGNOSIS — M4126 Other idiopathic scoliosis, lumbar region: Secondary | ICD-10-CM | POA: Diagnosis not present

## 2020-06-03 DIAGNOSIS — M9901 Segmental and somatic dysfunction of cervical region: Secondary | ICD-10-CM | POA: Diagnosis not present

## 2020-06-03 DIAGNOSIS — M5441 Lumbago with sciatica, right side: Secondary | ICD-10-CM | POA: Diagnosis not present

## 2020-06-03 DIAGNOSIS — M9903 Segmental and somatic dysfunction of lumbar region: Secondary | ICD-10-CM | POA: Diagnosis not present

## 2020-06-28 ENCOUNTER — Other Ambulatory Visit: Payer: Self-pay | Admitting: Family Medicine

## 2020-06-28 ENCOUNTER — Ambulatory Visit (INDEPENDENT_AMBULATORY_CARE_PROVIDER_SITE_OTHER): Payer: 59 | Admitting: Family Medicine

## 2020-06-28 ENCOUNTER — Other Ambulatory Visit: Payer: Self-pay

## 2020-06-28 ENCOUNTER — Other Ambulatory Visit (HOSPITAL_BASED_OUTPATIENT_CLINIC_OR_DEPARTMENT_OTHER): Payer: Self-pay

## 2020-06-28 ENCOUNTER — Encounter: Payer: Self-pay | Admitting: Family Medicine

## 2020-06-28 VITALS — BP 128/88 | HR 76 | Temp 97.8°F | Resp 20 | Ht 63.5 in | Wt 146.6 lb

## 2020-06-28 DIAGNOSIS — Z0000 Encounter for general adult medical examination without abnormal findings: Secondary | ICD-10-CM | POA: Diagnosis not present

## 2020-06-28 DIAGNOSIS — I10 Essential (primary) hypertension: Secondary | ICD-10-CM | POA: Diagnosis not present

## 2020-06-28 DIAGNOSIS — F419 Anxiety disorder, unspecified: Secondary | ICD-10-CM

## 2020-06-28 LAB — BASIC METABOLIC PANEL
BUN: 11 mg/dL (ref 6–23)
CO2: 26 mEq/L (ref 19–32)
Calcium: 9.6 mg/dL (ref 8.4–10.5)
Chloride: 101 mEq/L (ref 96–112)
Creatinine, Ser: 0.88 mg/dL (ref 0.40–1.50)
GFR: 114.14 mL/min (ref >60.00)
Glucose, Bld: 99 mg/dL (ref 70–99)
Potassium: 3.5 mEq/L (ref 3.5–5.1)
Sodium: 138 mEq/L (ref 135–145)

## 2020-06-28 LAB — CBC WITH DIFFERENTIAL/PLATELET
Basophils Absolute: 0 10*3/uL (ref 0.0–0.1)
Basophils Relative: 0.9 % (ref 0.0–3.0)
Eosinophils Absolute: 0.1 10*3/uL (ref 0.0–0.7)
Eosinophils Relative: 2.3 % (ref 0.0–5.0)
HCT: 48.4 % (ref 39.0–52.0)
Hemoglobin: 17 g/dL (ref 13.0–17.0)
Lymphocytes Relative: 35.6 % (ref 12.0–46.0)
Lymphs Abs: 1.5 10*3/uL (ref 0.7–4.0)
MCHC: 35.1 g/dL (ref 30.0–36.0)
MCV: 86.5 fl (ref 78.0–100.0)
Monocytes Absolute: 0.3 10*3/uL (ref 0.1–1.0)
Monocytes Relative: 7 % (ref 3.0–12.0)
Neutro Abs: 2.3 10*3/uL (ref 1.4–7.7)
Neutrophils Relative %: 54.2 % (ref 43.0–77.0)
Platelets: 204 10*3/uL (ref 150.0–400.0)
RBC: 5.59 Mil/uL (ref 4.22–5.81)
RDW: 12.2 % (ref 11.5–15.5)
WBC: 4.2 10*3/uL (ref 4.0–10.5)

## 2020-06-28 LAB — LIPID PANEL
Cholesterol: 161 mg/dL (ref 0–200)
HDL: 51.9 mg/dL (ref >39.00)
LDL Cholesterol: 89 mg/dL (ref 0–99)
NonHDL: 108.92
Total CHOL/HDL Ratio: 3
Triglycerides: 98 mg/dL (ref 0.0–149.0)
VLDL: 19.6 mg/dL (ref 0.0–40.0)

## 2020-06-28 LAB — HEPATIC FUNCTION PANEL
ALT: 110 U/L — ABNORMAL HIGH (ref 0–53)
AST: 47 U/L — ABNORMAL HIGH (ref 0–37)
Albumin: 4.9 g/dL (ref 3.5–5.2)
Alkaline Phosphatase: 73 U/L (ref 39–117)
Bilirubin, Direct: 0.1 mg/dL (ref 0.0–0.3)
Total Bilirubin: 0.7 mg/dL (ref 0.2–1.2)
Total Protein: 7.3 g/dL (ref 6.0–8.3)

## 2020-06-28 LAB — TSH: TSH: 2.73 u[IU]/mL (ref 0.35–4.50)

## 2020-06-28 MED ORDER — SERTRALINE HCL 50 MG PO TABS
ORAL_TABLET | Freq: Every day | ORAL | 1 refills | Status: DC
  Filled 2020-06-28: qty 30, 30d supply, fill #0
  Filled 2020-08-02: qty 30, 30d supply, fill #1

## 2020-06-28 NOTE — Assessment & Plan Note (Signed)
Chronic problem.  Adequate control.  Currently asymptomatic.  Pt will continue to monitor at home.

## 2020-06-28 NOTE — Patient Instructions (Addendum)
Follow up in 6 months to recheck BP We'll notify you of your lab results and make any changes if needed Continue to work on healthy diet and regular exercise- you can do it! Continue to monitor BP- let me know if consistently >140/90 Call with any questions or concerns Stay Safe!  Stay Healthy!

## 2020-06-28 NOTE — Progress Notes (Signed)
   Subjective:    Patient ID: Miguel Love, male    DOB: May 13, 1990, 30 y.o.   MRN: [id number]  HPI CPE- UTD on Tdap, COVID.  Reviewed past medical, surgical, family and social histories.   Health Maintenance  Topic Date Due   Pneumococcal Vaccine 69-42 Years old (1 - PCV) Never done   COVID-19 Vaccine (4 - Booster for Pfizer series) 01/17/2020   INFLUENZA VACCINE  08/09/2020   TETANUS/TDAP  06/08/2021   Hepatitis C Screening  Completed   HIV Screening  Completed   HPV VACCINES  Aged Out      Review of Systems Patient reports no vision/hearing changes, anorexia, fever ,adenopathy, persistant/recurrent hoarseness, swallowing issues, chest pain, palpitations, edema, persistant/recurrent cough, hemoptysis, dyspnea (rest,exertional, paroxysmal nocturnal), gastrointestinal  bleeding (melena, rectal bleeding), abdominal pain, excessive heart burn, GU symptoms (dysuria, hematuria, voiding/incontinence issues) syncope, focal weakness, memory loss, numbness & tingling, skin/hair/nail changes, depression, anxiety, abnormal bruising/bleeding, musculoskeletal symptoms/signs.   This visit occurred during the SARS-CoV-2 public health emergency.  Safety protocols were in place, including screening questions prior to the visit, additional usage of staff PPE, and extensive cleaning of exam room while observing appropriate contact time as indicated for disinfecting solutions.      Objective:   Physical Exam General Appearance:    Alert, cooperative, no distress, appears stated age  Head:    Normocephalic, without obvious abnormality, atraumatic  Eyes:    PERRL, conjunctiva/corneas clear, EOM's intact, fundi    benign, both eyes       Ears:    Normal TM's and external ear canals, both ears  Nose:   Deferred due to COVID  Throat:   Neck:   Supple, symmetrical, trachea midline, no adenopathy;       thyroid:  No enlargement/tenderness/nodules  Back:     Symmetric, no curvature, ROM normal, no CVA  tenderness  Lungs:     Clear to auscultation bilaterally, respirations unlabored  Chest wall:    No tenderness or deformity  Heart:    Regular rate and rhythm, S1 and S2 normal, no murmur, rub   or gallop  Abdomen:     Soft, non-tender, bowel sounds active all four quadrants,    no masses, no organomegaly  Genitalia:    Deferred  Rectal:    Extremities:   Extremities normal, atraumatic, no cyanosis or edema  Pulses:   2+ and symmetric all extremities  Skin:   Skin color, texture, turgor normal, no rashes or lesions  Lymph nodes:   Cervical, supraclavicular, and axillary nodes normal  Neurologic:   CNII-XII intact. Normal strength, sensation and reflexes      throughout          Assessment & Plan:

## 2020-06-28 NOTE — Assessment & Plan Note (Signed)
Pt's PE WNL.  UTD on immunizations.  Check labs.  Anticipatory guidance provided.  

## 2020-06-29 ENCOUNTER — Other Ambulatory Visit: Payer: Self-pay

## 2020-06-29 ENCOUNTER — Other Ambulatory Visit (HOSPITAL_BASED_OUTPATIENT_CLINIC_OR_DEPARTMENT_OTHER): Payer: Self-pay

## 2020-06-29 DIAGNOSIS — R7989 Other specified abnormal findings of blood chemistry: Secondary | ICD-10-CM

## 2020-07-07 ENCOUNTER — Encounter: Payer: Self-pay | Admitting: *Deleted

## 2020-07-16 ENCOUNTER — Encounter: Payer: Self-pay | Admitting: Family Medicine

## 2020-07-23 ENCOUNTER — Encounter: Payer: Self-pay | Admitting: Registered Nurse

## 2020-07-23 ENCOUNTER — Other Ambulatory Visit: Payer: Self-pay

## 2020-07-23 ENCOUNTER — Telehealth (INDEPENDENT_AMBULATORY_CARE_PROVIDER_SITE_OTHER): Payer: 59 | Admitting: Registered Nurse

## 2020-07-23 ENCOUNTER — Other Ambulatory Visit (HOSPITAL_BASED_OUTPATIENT_CLINIC_OR_DEPARTMENT_OTHER): Payer: Self-pay

## 2020-07-23 VITALS — BP 138/93 | HR 60 | Temp 97.9°F | Wt 147.0 lb

## 2020-07-23 DIAGNOSIS — J029 Acute pharyngitis, unspecified: Secondary | ICD-10-CM

## 2020-07-23 MED ORDER — AMOXICILLIN-POT CLAVULANATE 875-125 MG PO TABS
1.0000 | ORAL_TABLET | Freq: Two times a day (BID) | ORAL | 0 refills | Status: DC
  Filled 2020-07-23: qty 14, 7d supply, fill #0

## 2020-07-23 NOTE — Progress Notes (Signed)
Telemedicine Encounter- SOAP NOTE Established Patient  This telephone encounter was conducted with the patient's (or proxy's) verbal consent via audio telecommunications: yes/no: Yes Patient was instructed to have this encounter in a suitably private space; and to only have persons present to whom they give permission to participate. In addition, patient identity was confirmed by use of name plus two identifiers (DOB and address).  I discussed the limitations, risks, security and privacy concerns of performing an evaluation and management service by telephone and the availability of in person appointments. I also discussed with the patient that there may be a patient responsible charge related to this service. The patient expressed understanding and agreed to proceed.  I spent a total of 13  talking with the patient or their proxy.  Patient at home Provider in office  Participants: Kathrin Ruddy, NP and [name]  Chief Complaint  Patient presents with   Sore Throat    Patient states since Monday he started Having a sore throat issues. Patient states he usually get tonsil stones and went to clear them out and got 5 stones off of on tonsil. Patient states he has been feeling like its been razor cuts in his mouth. His wife tested positive for covid 15 days ago , but he took 5 test and all was negative.    Subjective   Miguel Love is a 30 y.o. established patient. Telephone visit today for sore throat  HPI Onset about a week ago Notes wife had COVID 15 days ago - he has tested four times since, no positive results including one as recently as Wednesday, 2 days before this visit and 4-5 days after symptoms onset.  About a week ago had tonsil stones, manual disimpaction Had expected soreness after, but unfortunately this has progressed Pain with swallowing - pain in tonsillar lymph nodes and pressure in ears  No fever, changes in appetite  Feels familiar to strep/tonsillitis.  Does not get these infections too frequently - last one was around 3-4 years ago.  Patient Active Problem List   Diagnosis Date Noted   History of COVID-19 07/04/2019   IBS (irritable bowel syndrome) 07/20/2014   HTN (hypertension) 10/04/2012   General medical examination 06/29/2011   Childhood asthma 05/15/2011   Elevated urine levels of catecholamines 05/15/2011   Anxiety 05/15/2011   Weight loss 05/15/2011    Past Medical History:  Diagnosis Date   Anxiety    Asthma    Cardiac arrhythmia due to congenital heart disease    pt denies 12/25/12   Gallbladder polyp    Hypertension    Palpitations     Current Outpatient Medications  Medication Sig Dispense Refill   amoxicillin-clavulanate (AUGMENTIN) 875-125 MG tablet Take 1 tablet by mouth 2 (two) times daily. 14 tablet 0   fluticasone (FLONASE) 50 MCG/ACT nasal spray PLACE 2 SPRAYS INTO BOTH NOSTRILS DAILY 16 g 3   loratadine (CLARITIN) 10 MG tablet Take 1 tablet (10 mg total) by mouth daily. 90 tablet 3   zinc gluconate 50 MG tablet Take 50 mg by mouth daily.     albuterol (VENTOLIN HFA) 108 (90 Base) MCG/ACT inhaler Inhale 2 puffs into the lungs every 6 (six) hours as needed for wheezing or shortness of breath. 8.5 g 0   lidocaine (XYLOCAINE) 2 % solution Use as directed 15 mLs in the mouth or throat as needed for mouth pain. 300 mL 0   metoprolol succinate (TOPROL-XL) 25 MG 24 hr tablet TAKE  1 TABLET (25 MG TOTAL) BY MOUTH DAILY. 90 tablet 1   predniSONE (DELTASONE) 10 MG tablet Take 2 tablets (20 mg total) by mouth daily. 15 tablet 0   sertraline (ZOLOFT) 50 MG tablet TAKE 1 TABLET BY MOUTH ONCE DAILY 30 tablet 1   No current facility-administered medications for this visit.    Allergies  Allergen Reactions   Nsaids Shortness Of Breath    Asthma attack    Social History   Socioeconomic History   Marital status: Single    Spouse name: Not on file   Number of children: 0   Years of education: Not on file    Highest education level: Not on file  Occupational History    Employer: [employer]  Tobacco Use   Smoking status: Never   Smokeless tobacco: Never  Vaping Use   Vaping Use: Never used  Substance and Sexual Activity   Alcohol use: Yes    Comment: occassional   Drug use: No   Sexual activity: Yes  Other Topics Concern   Not on file  Social History Narrative   Not on file   Social Determinants of Health   Financial Resource Strain: Not on file  Food Insecurity: Not on file  Transportation Needs: Not on file  Physical Activity: Not on file  Stress: Not on file  Social Connections: Not on file  Intimate Partner Violence: Not on file    ROS  Objective   Vitals as reported by the patient: Today's Vitals   07/23/20 1343  BP: (!) 138/93  Pulse: 60  Temp: 97.9 F (36.6 C)  TempSrc: Temporal  Weight: 147 lb (66.7 kg)    [name] was seen today for sore throat.  Diagnoses and all orders for this visit:  Pharyngitis, unspecified etiology -     amoxicillin-clavulanate (AUGMENTIN) 875-125 MG tablet; Take 1 tablet by mouth 2 (two) times daily.  PLAN Feel likely bacterial etiology given duration of symptoms. Augmentin as above Reviewed return precautions and supportive care. Pt voices understanding Patient encouraged to call clinic with any questions, comments, or concerns.   I discussed the assessment and treatment plan with the patient. The patient was provided an opportunity to ask questions and all were answered. The patient agreed with the plan and demonstrated an understanding of the instructions.   The patient was advised to call back or seek an in-person evaluation if the symptoms worsen or if the condition fails to improve as anticipated.  I provided 13 minutes of non-face-to-face time during this encounter.  Maximiano Coss, NP

## 2020-07-23 NOTE — Patient Instructions (Signed)
° ° ° °  If you have lab work done today you will be contacted with your lab results within the next 2 weeks.  If you have not heard from us then please contact us. The fastest way to get your results is to register for My Chart. ° ° °IF you received an x-ray today, you will receive an invoice from [facility]. Please contact [facility] at [phone] with questions or concerns regarding your invoice.  ° °IF you received labwork today, you will receive an invoice from [facility]. Please contact [facility] at [phone] with questions or concerns regarding your invoice.  ° °Our billing staff will not be able to assist you with questions regarding bills from these companies. ° °You will be contacted with the lab results as soon as they are available. The fastest way to get your results is to activate your My Chart account. Instructions are located on the last page of this paperwork. If you have not heard from us regarding the results in 2 weeks, please contact this office. °  ° ° ° °

## 2020-07-25 ENCOUNTER — Emergency Department (HOSPITAL_BASED_OUTPATIENT_CLINIC_OR_DEPARTMENT_OTHER)
Admission: EM | Admit: 2020-07-25 | Discharge: 2020-07-25 | Disposition: A | Discharge status: Home / Self Care | Payer: 59 | Attending: Emergency Medicine | Admitting: Emergency Medicine

## 2020-07-25 ENCOUNTER — Encounter (HOSPITAL_BASED_OUTPATIENT_CLINIC_OR_DEPARTMENT_OTHER): Payer: Self-pay | Admitting: Emergency Medicine

## 2020-07-25 ENCOUNTER — Other Ambulatory Visit: Payer: Self-pay

## 2020-07-25 DIAGNOSIS — J45909 Unspecified asthma, uncomplicated: Secondary | ICD-10-CM | POA: Diagnosis not present

## 2020-07-25 DIAGNOSIS — Z8616 Personal history of COVID-19: Secondary | ICD-10-CM | POA: Insufficient documentation

## 2020-07-25 DIAGNOSIS — I10 Essential (primary) hypertension: Secondary | ICD-10-CM | POA: Insufficient documentation

## 2020-07-25 DIAGNOSIS — Z79899 Other long term (current) drug therapy: Secondary | ICD-10-CM | POA: Insufficient documentation

## 2020-07-25 DIAGNOSIS — J358 Other chronic diseases of tonsils and adenoids: Secondary | ICD-10-CM | POA: Diagnosis not present

## 2020-07-25 DIAGNOSIS — J029 Acute pharyngitis, unspecified: Secondary | ICD-10-CM | POA: Diagnosis not present

## 2020-07-25 LAB — GROUP A STREP BY PCR: Group A Strep by PCR: NOT DETECTED

## 2020-07-25 MED ORDER — IPRATROPIUM-ALBUTEROL 0.5-2.5 (3) MG/3ML IN SOLN
3.0000 mL | Freq: Once | RESPIRATORY_TRACT | Status: AC
Start: 2020-07-25 — End: 2020-07-25
  Administered 2020-07-25: 3 mL via RESPIRATORY_TRACT

## 2020-07-25 MED ORDER — DEXAMETHASONE SODIUM PHOSPHATE 10 MG/ML IJ SOLN
10.0000 mg | Freq: Once | INTRAMUSCULAR | Status: AC
  Administered 2020-07-25: 10 mg via INTRAMUSCULAR
  Filled 2020-07-25: qty 1

## 2020-07-25 MED ORDER — PREDNISONE 10 MG PO TABS: 20.0000 mg | ORAL_TABLET | Freq: Every day | ORAL | 0 refills | Status: DC

## 2020-07-25 MED ORDER — LIDOCAINE VISCOUS HCL 2 % MT SOLN: 15.0000 mL | OROMUCOSAL | 0 refills | Status: DC | PRN

## 2020-07-25 NOTE — ED Notes (Signed)
See EDP assessment 

## 2020-07-25 NOTE — ED Triage Notes (Signed)
Pt c/o sore throat R>L and hx of tonsil stones. States he removed some and usually it resolves it but his pain continues. Pt is able to swallow. He states he did a video visit and was put on Augmentin on Friday. Hoarse voice but pt is able to speak in complete sentences. Pt requesting steroids.

## 2020-07-25 NOTE — ED Provider Notes (Signed)
[facility] EMERGENCY DEPARTMENT Provider Note   CSN: [id number] Arrival date & time: 07/25/20  0553     History Chief Complaint  Patient presents with   Sore Throat    Miguel Love is a 30 y.o. male.  Patient presents to the emergency department for evaluation of sore throat.  Patient reports that he started having problems with his throat several days ago.  He has a history of tonsil stones, past tonsil stone at that time.  Yesterday he had more tonsil stones showup and since then he has been having more significant pain in his throat, mostly on the right side when he swallows.  He woke up this morning gasping for air and therefore came to be evaluated.      Past Medical History:  Diagnosis Date   Anxiety    Asthma    Cardiac arrhythmia due to congenital heart disease    pt denies 12/25/12   Gallbladder polyp    Hypertension    Palpitations     Patient Active Problem List   Diagnosis Date Noted   History of COVID-19 07/04/2019   IBS (irritable bowel syndrome) 07/20/2014   HTN (hypertension) 10/04/2012   General medical examination 06/29/2011   Childhood asthma 05/15/2011   Elevated urine levels of catecholamines 05/15/2011   Anxiety 05/15/2011   Weight loss 05/15/2011    Past Surgical History:  Procedure Laterality Date   ANKLE ARTHROSCOPY WITH OPEN REDUCTION INTERNAL FIXATION (ORIF) Right 02/24/2020   Procedure: RIGHT ANKLE ARTHROSCOPIC DEBRIDEMENT, OPEN TREATMENT OF SYNDESMOSIS;  Surgeon: Erle Crocker, MD;  Location: [facility];  Service: Orthopedics;  Laterality: Right;  LENGTH OF SURGERY: 2 HOURS   WISDOM TOOTH EXTRACTION  2016       Family History  Problem Relation Age of Onset   Hypertension Father    Colon polyps Father    Lung cancer Maternal Grandfather    Hypertension Paternal Grandmother    Stroke Paternal Grandmother    Hypertension Paternal Grandfather    Colon cancer Paternal Grandfather        great grand    Social  History   Tobacco Use   Smoking status: Never   Smokeless tobacco: Never  Vaping Use   Vaping Use: Never used  Substance Use Topics   Alcohol use: Yes    Comment: occassional   Drug use: No    Home Medications Prior to Admission medications   Medication Sig Start Date End Date Taking? Authorizing Provider  lidocaine (XYLOCAINE) 2 % solution Use as directed 15 mLs in the mouth or throat as needed for mouth pain. 07/25/20  Yes Analuisa Tudor, Gwenyth Allegra, MD  predniSONE (DELTASONE) 10 MG tablet Take 2 tablets (20 mg total) by mouth daily. 07/25/20  Yes Talbot Monarch, Gwenyth Allegra, MD  amoxicillin-clavulanate (AUGMENTIN) 875-125 MG tablet Take 1 tablet by mouth 2 (two) times daily. 07/23/20   Maximiano Coss, NP  fluticasone [facility]) 50 MCG/ACT nasal spray PLACE 2 SPRAYS INTO BOTH NOSTRILS DAILY 01/23/20 01/22/21  Midge Minium, MD  loratadine (CLARITIN) 10 MG tablet Take 1 tablet (10 mg total) by mouth daily. 03/25/20   Midge Minium, MD  metoprolol succinate (TOPROL-XL) 25 MG 24 hr tablet TAKE 1 TABLET (25 MG TOTAL) BY MOUTH DAILY. 04/30/20 04/30/21  Midge Minium, MD  sertraline (ZOLOFT) 50 MG tablet TAKE 1 TABLET BY MOUTH ONCE DAILY 06/28/20 06/28/21  Midge Minium, MD  zinc gluconate 50 MG tablet Take 50 mg by mouth daily.  [provider]    Allergies    Nsaids  Review of Systems   Review of Systems  Constitutional:  Negative for fever.  HENT:  Positive for sore throat.   All other systems reviewed and are negative.  Physical Exam Updated Vital Signs Pulse 84   Temp 100 F (37.8 C) (Oral)   Resp 19   SpO2 99%   Physical Exam Vitals and nursing note reviewed.  Constitutional:      General: He is not in acute distress.    Appearance: Normal appearance. He is well-developed.  HENT:     Head: Normocephalic and atraumatic.     Right Ear: Hearing normal.     Left Ear: Hearing normal.     Nose: Nose normal.     Mouth/Throat:     Pharynx: Posterior  oropharyngeal erythema present. No oropharyngeal exudate.  Eyes:     Conjunctiva/sclera: Conjunctivae normal.     Pupils: Pupils are equal, round, and reactive to light.  Cardiovascular:     Rate and Rhythm: Regular rhythm.     Heart sounds: S1 normal and S2 normal. No murmur heard.   No friction rub. No gallop.  Pulmonary:     Effort: Pulmonary effort is normal. No respiratory distress.     Breath sounds: Normal breath sounds.  Chest:     Chest wall: No tenderness.  Abdominal:     General: Bowel sounds are normal.     Palpations: Abdomen is soft.     Tenderness: There is no abdominal tenderness. There is no guarding or rebound. Negative signs include Murphy's sign and McBurney's sign.     Hernia: No hernia is present.  Musculoskeletal:        General: Normal range of motion.     Cervical back: Normal range of motion and neck supple.  Skin:    General: Skin is warm and dry.     Findings: No rash.  Neurological:     Mental Status: He is alert and oriented to person, place, and time.     GCS: GCS eye subscore is 4. GCS verbal subscore is 5. GCS motor subscore is 6.     Cranial Nerves: No cranial nerve deficit.     Sensory: No sensory deficit.     Coordination: Coordination normal.  Psychiatric:        Speech: Speech normal.        Behavior: Behavior normal.        Thought Content: Thought content normal.    ED Results / Procedures / Treatments   Labs (all labs ordered are listed, but only abnormal results are displayed) Labs Reviewed  GROUP A STREP BY PCR    EKG None  Radiology No results found.  Procedures Procedures   Medications Ordered in ED Medications  dexamethasone (DECADRON) injection 10 mg (has no administration in time range)  ipratropium-albuterol (DUONEB) 0.5-2.5 (3) MG/3ML nebulizer solution 3 mL (has no administration in time range)    ED Course  I have reviewed the triage vital signs and the nursing notes.  Pertinent labs & imaging results  that were available during my care of the patient were reviewed by me and considered in my medical decision making (see chart for details).    MDM Rules/Calculators/A&P                          Patient presents for sore throat.  He has been dealing with tonsil stones  for the last several days.  He did a televisit 2 days ago and was started on Augmentin.  He appears well at this time.  Oropharyngeal exam reveals mild erythema but no swelling or airway compromise.  Lungs are clear.  Vital signs are unremarkable.  We will treat with Decadron and topical lidocaine. Final Clinical Impression(s) / ED Diagnoses Final diagnoses:  Sore throat  Tonsil stone    Rx / DC Orders ED Discharge Orders          Ordered    predniSONE (DELTASONE) 10 MG tablet  Daily        07/25/20 0615    lidocaine (XYLOCAINE) 2 % solution  As needed        07/25/20 0615             Orpah Greek, MD 07/25/20 [phone]

## 2020-07-26 ENCOUNTER — Other Ambulatory Visit (HOSPITAL_BASED_OUTPATIENT_CLINIC_OR_DEPARTMENT_OTHER): Payer: Self-pay

## 2020-07-26 ENCOUNTER — Encounter: Payer: Self-pay | Admitting: Registered Nurse

## 2020-07-27 ENCOUNTER — Encounter: Payer: Self-pay | Admitting: Family Medicine

## 2020-07-27 ENCOUNTER — Other Ambulatory Visit (HOSPITAL_BASED_OUTPATIENT_CLINIC_OR_DEPARTMENT_OTHER): Payer: Self-pay

## 2020-07-27 MED ORDER — ALBUTEROL SULFATE HFA 108 (90 BASE) MCG/ACT IN AERS
2.0000 | INHALATION_SPRAY | Freq: Four times a day (QID) | RESPIRATORY_TRACT | 0 refills | Status: DC | PRN
  Filled 2020-07-27: qty 8.5, 25d supply, fill #0

## 2020-07-29 ENCOUNTER — Other Ambulatory Visit: Payer: 59

## 2020-08-02 ENCOUNTER — Other Ambulatory Visit (HOSPITAL_BASED_OUTPATIENT_CLINIC_OR_DEPARTMENT_OTHER): Payer: Self-pay

## 2020-08-17 ENCOUNTER — Other Ambulatory Visit (INDEPENDENT_AMBULATORY_CARE_PROVIDER_SITE_OTHER): Payer: 59

## 2020-08-17 ENCOUNTER — Other Ambulatory Visit: Payer: Self-pay

## 2020-08-17 DIAGNOSIS — R7989 Other specified abnormal findings of blood chemistry: Secondary | ICD-10-CM | POA: Diagnosis not present

## 2020-08-17 LAB — HEPATIC FUNCTION PANEL
ALT: 64 U/L — ABNORMAL HIGH (ref 0–53)
AST: 30 U/L (ref 0–37)
Albumin: 4.4 g/dL (ref 3.5–5.2)
Alkaline Phosphatase: 63 U/L (ref 39–117)
Bilirubin, Direct: 0.2 mg/dL (ref 0.0–0.3)
Total Bilirubin: 1 mg/dL (ref 0.2–1.2)
Total Protein: 6.6 g/dL (ref 6.0–8.3)

## 2020-08-18 ENCOUNTER — Other Ambulatory Visit (HOSPITAL_BASED_OUTPATIENT_CLINIC_OR_DEPARTMENT_OTHER): Payer: Self-pay

## 2020-08-18 ENCOUNTER — Other Ambulatory Visit: Payer: Self-pay | Admitting: Family Medicine

## 2020-08-18 ENCOUNTER — Ambulatory Visit: Payer: 59 | Admitting: Dermatology

## 2020-08-18 ENCOUNTER — Encounter: Payer: Self-pay | Admitting: Dermatology

## 2020-08-18 DIAGNOSIS — Z1283 Encounter for screening for malignant neoplasm of skin: Secondary | ICD-10-CM

## 2020-08-18 DIAGNOSIS — D1800 Hemangioma unspecified site: Secondary | ICD-10-CM

## 2020-08-18 DIAGNOSIS — D2271 Melanocytic nevi of right lower limb, including hip: Secondary | ICD-10-CM

## 2020-08-18 DIAGNOSIS — L649 Androgenic alopecia, unspecified: Secondary | ICD-10-CM | POA: Diagnosis not present

## 2020-08-18 DIAGNOSIS — D229 Melanocytic nevi, unspecified: Secondary | ICD-10-CM

## 2020-08-18 DIAGNOSIS — L821 Other seborrheic keratosis: Secondary | ICD-10-CM

## 2020-08-18 DIAGNOSIS — L578 Other skin changes due to chronic exposure to nonionizing radiation: Secondary | ICD-10-CM | POA: Diagnosis not present

## 2020-08-18 DIAGNOSIS — L813 Cafe au lait spots: Secondary | ICD-10-CM | POA: Diagnosis not present

## 2020-08-18 DIAGNOSIS — L814 Other melanin hyperpigmentation: Secondary | ICD-10-CM

## 2020-08-18 DIAGNOSIS — F419 Anxiety disorder, unspecified: Secondary | ICD-10-CM

## 2020-08-18 MED ORDER — METOPROLOL SUCCINATE ER 25 MG PO TB24
ORAL_TABLET | Freq: Every day | ORAL | 1 refills | Status: DC
  Filled 2020-08-18: qty 90, fill #0
  Filled 2020-11-17: qty 90, 90d supply, fill #0
  Filled 2021-02-04: qty 90, 90d supply, fill #1

## 2020-08-18 MED ORDER — SERTRALINE HCL 50 MG PO TABS
ORAL_TABLET | Freq: Every day | ORAL | 1 refills | Status: DC
  Filled 2020-08-18: qty 30, fill #0
  Filled 2020-09-02: qty 30, 30d supply, fill #0
  Filled 2020-09-29: qty 30, 30d supply, fill #1

## 2020-08-18 NOTE — Patient Instructions (Addendum)
Recommend minoxidil 5% (Rogaine for men) solution or foam to be applied to the scalp and left in. This should ideally be used twice daily for best results but it helps with hair regrowth when used at least three times per week. Rogaine initially can cause increased hair shedding for the first few weeks but this will stop with continued use. In studies, people who used minoxidil (Rogaine) for at least 6 months had thicker hair than people who did not. Minoxidil topical (Rogaine) only works as long as it continues to be used. If if it is no longer used then the hair it has been helping to regrow can fall out. Minoxidil topical (Rogaine) can cause increased facial hair growth.  Recommend taking Heliocare sun protection supplement daily in sunny weather for additional sun protection. For maximum protection on the sunniest days, you can take up to 2 capsules of regular Heliocare OR take 1 capsule of Heliocare Ultra. For prolonged exposure (such as a full day in the sun), you can repeat your dose of the supplement 4 hours after your first dose. Heliocare can be purchased at [facility] or at [email or web address].    Melanoma ABCDEs  Melanoma is the most dangerous type of skin cancer, and is the leading cause of death from skin disease.  You are more likely to develop melanoma if you: Have light-colored skin, light-colored eyes, or red or blond hair Spend a lot of time in the sun Tan regularly, either outdoors or in a tanning bed Have had blistering sunburns, especially during childhood Have a close family member who has had a melanoma Have atypical moles or large birthmarks  Early detection of melanoma is key since treatment is typically straightforward and cure rates are extremely high if we catch it early.   The first sign of melanoma is often a change in a mole or a new dark spot.  The ABCDE system is a way of remembering the signs of melanoma.  A for asymmetry:  The two halves do not match. B  for border:  The edges of the growth are irregular. C for color:  A mixture of colors are present instead of an even brown color. D for diameter:  Melanomas are usually (but not always) greater than 94m - the size of a pencil eraser. E for evolution:  The spot keeps changing in size, shape, and color.  Please check your skin once per month between visits. You can use a small mirror in front and a large mirror behind you to keep an eye on the back side or your body.   If you see any new or changing lesions before your next follow-up, please call to schedule a visit.  Please continue daily skin protection including broad spectrum sunscreen SPF 30+ to sun-exposed areas, reapplying every 2 hours as needed when you're outdoors.    If you have any questions or concerns for your doctor, please call our main line at [phone] press option 4 to reach your doctor's medical assistant. If no one answers, please leave a voicemail as directed and we will return your call as soon as possible. Messages left after 4 pm will be answered the following business day.   You may also send [place] message via [employer] We typically respond to MyChart messages within 1-2 business days.  For prescription refills, please ask your pharmacy to contact our office. Our fax number is [phone]  If you have an urgent issue when the clinic is closed  that cannot wait until the next business day, you can page your doctor at the number below.    Please note that while we do our best to be available for urgent issues outside of office hours, we are not available 24/7.   If you have an urgent issue and are unable to reach [place], you may choose to seek medical care at your doctor's office, retail clinic, urgent care center, or emergency room.  If you have a medical emergency, please immediately call 911 or go to the emergency department.  Pager Numbers  - Dr. Nehemiah Massed: [phone]  - Dr. Laurence Ferrari: [phone]  - Dr.  Nicole Kindred: [phone]  In the event of inclement weather, please call our main line at [phone] for an update on the status of any delays or closures.  Dermatology Medication Tips: Please keep the boxes that topical medications come in in order to help keep track of the instructions about where and how to use these. Pharmacies typically print the medication instructions only on the boxes and not directly on the medication tubes.   If your medication is too expensive, please contact our office at [phone] option 4 or send [place] a message through [place].   We are unable to tell what your co-pay for medications will be in advance as this is different depending on your insurance coverage. However, we may be able to find a substitute medication at lower cost or fill out paperwork to get insurance to cover a needed medication.   If a prior authorization is required to get your medication covered by your insurance company, please allow [place] 1-2 business days to complete this process.  Drug prices often vary depending on where the prescription is filled and some pharmacies may offer cheaper prices.  The website www.goodrx.com contains coupons for medications through different pharmacies. The prices here do not account for what the cost may be with help from insurance (it may be cheaper with your insurance), but the website can give you the price if you did not use any insurance.  - You can print the associated coupon and take it with your prescription to the pharmacy.  - You may also stop by our office during regular business hours and pick up a GoodRx coupon card.  - If you need your prescription sent electronically to a different pharmacy, notify our office through [facility] or by phone at [phone] option 4.

## 2020-08-18 NOTE — Progress Notes (Signed)
Follow-Up Visit   Subjective  Miguel Love is a 30 y.o. male who presents for the following: FBSE (Patient here for full body skin exam and skin cancer screening. Patient does not have a hx of skin cancer or dysplastic nevi. He has been taking nutrafol and using peppermint and rosemary oil for hair loss for almost 1 year and feels that there is some regrowth. ).  Patient was using Rogaine at last appointment but did not see results and discontinued after 3 months.   The following portions of the chart were reviewed this encounter and updated as appropriate:   Tobacco  Allergies  Meds  Problems  Med Hx  Surg Hx  Fam Hx      Review of Systems:  No other skin or systemic complaints except as noted in HPI or Assessment and Plan.  Objective  Well appearing patient in no apparent distress; mood and affect are within normal limits.  A full examination was performed including scalp, head, eyes, ears, nose, lips, neck, chest, axillae, abdomen, back, buttocks, bilateral upper extremities, bilateral lower extremities, hands, feet, fingers, toes, fingernails, and toenails. All findings within normal limits unless otherwise noted below.  Scalp Decreased hair density frontal and vertex scalp Slight regression of hairline improved from prior  Right 3rd Toe; Right 1st-2nd Toe Web Right 1st-2nd Toe Web: 0.4cm dark brown macule Right 3rd Toe: 0.3cm brown macule   Assessment & Plan  Androgenetic alopecia Scalp  Patient advised Rogaine can take up to 6 months before seeing improvement.  Recommend restarting.   Continue Nutrafol, rosemary and peppermint oils as long as not having any reaction.  Recommend minoxidil 5% (Rogaine for men) solution or foam to be applied to the scalp and left in. This should ideally be used twice daily for best results but it helps with hair regrowth when used at least three times per week. Rogaine initially can cause increased hair shedding for the first  few weeks but this will stop with continued use. In studies, people who used minoxidil (Rogaine) for at least 6 months had thicker hair than people who did not. Minoxidil topical (Rogaine) only works as long as it continues to be used. If if it is no longer used then the hair it has been helping to regrow can fall out. Minoxidil topical (Rogaine) can cause increased facial hair growth.  Discussed treatment with prescription PO minoxidil. Pt defers today. Also discussed other therapy options including PO prescription finasteride vs red light cap vs PRP injections.   Chronic progressive condition with duration over one year. Condition is bothersome to patient. Not currently at goal.   Nevus Right 3rd Toe; Right 1st-2nd Toe Web  Benign-appearing.  Observation.  Call clinic for new or changing lesions.  Recommend daily use of broad spectrum spf 30+ sunscreen to sun-exposed areas.    Lentigines - Scattered tan macules - Due to sun exposure - Benign-appering, observe - Recommend daily broad spectrum sunscreen SPF 30+ to sun-exposed areas, reapply every 2 hours as needed. - Call for any changes  Seborrheic Keratoses - Stuck-on, waxy, tan-brown papules and/or plaques  - Benign-appearing - Discussed benign etiology and prognosis. - Observe - Call for any changes  Melanocytic Nevi - Tan-brown and/or pink-flesh-colored symmetric macules and papules - Benign appearing on exam today - Observation - Call clinic for new or changing moles - Recommend daily use of broad spectrum spf 30+ sunscreen to sun-exposed areas.   Hemangiomas - Red papules - Discussed benign nature -  Observe - Call for any changes  Actinic Damage - Chronic condition, secondary to cumulative UV/sun exposure - diffuse scaly erythematous macules with underlying dyspigmentation - Recommend daily broad spectrum sunscreen SPF 30+ to sun-exposed areas, reapply every 2 hours as needed.  - Staying in the shade or wearing long  sleeves, sun glasses (UVA+UVB protection) and wide brim hats (4-inch brim around the entire circumference of the hat) are also recommended for sun protection.  - Call for new or changing lesions. - Recommend taking Heliocare sun protection supplement daily in sunny weather for additional sun protection. For maximum protection on the sunniest days, you can take up to 2 capsules of regular Heliocare OR take 1 capsule of Heliocare Ultra. For prolonged exposure (such as a full day in the sun), you can repeat your dose of the supplement 4 hours after your first dose.  Skin cancer screening performed today.  Cafe au Lait  - Tan patch at right lower back - Genetic - Benign, observe - Call for any changes  Return in about 1 year (around 08/18/2021) for TBSE.  Graciella Belton, RMA, am acting as scribe for Forest Gleason, MD .  Documentation: I have reviewed the above documentation for accuracy and completeness, and I agree with the above.  Forest Gleason, MD

## 2020-09-02 ENCOUNTER — Other Ambulatory Visit (HOSPITAL_BASED_OUTPATIENT_CLINIC_OR_DEPARTMENT_OTHER): Payer: Self-pay

## 2020-09-29 ENCOUNTER — Other Ambulatory Visit (HOSPITAL_BASED_OUTPATIENT_CLINIC_OR_DEPARTMENT_OTHER): Payer: Self-pay

## 2020-10-06 DIAGNOSIS — M5137 Other intervertebral disc degeneration, lumbosacral region: Secondary | ICD-10-CM | POA: Diagnosis not present

## 2020-10-06 DIAGNOSIS — M5442 Lumbago with sciatica, left side: Secondary | ICD-10-CM | POA: Diagnosis not present

## 2020-10-06 DIAGNOSIS — M5441 Lumbago with sciatica, right side: Secondary | ICD-10-CM | POA: Diagnosis not present

## 2020-10-06 DIAGNOSIS — M9903 Segmental and somatic dysfunction of lumbar region: Secondary | ICD-10-CM | POA: Diagnosis not present

## 2020-10-06 DIAGNOSIS — M40202 Unspecified kyphosis, cervical region: Secondary | ICD-10-CM | POA: Diagnosis not present

## 2020-10-06 DIAGNOSIS — M4126 Other idiopathic scoliosis, lumbar region: Secondary | ICD-10-CM | POA: Diagnosis not present

## 2020-10-06 DIAGNOSIS — M9902 Segmental and somatic dysfunction of thoracic region: Secondary | ICD-10-CM | POA: Diagnosis not present

## 2020-10-06 DIAGNOSIS — M9904 Segmental and somatic dysfunction of sacral region: Secondary | ICD-10-CM | POA: Diagnosis not present

## 2020-10-06 DIAGNOSIS — M9901 Segmental and somatic dysfunction of cervical region: Secondary | ICD-10-CM | POA: Diagnosis not present

## 2020-11-02 ENCOUNTER — Other Ambulatory Visit: Payer: Self-pay | Admitting: Family Medicine

## 2020-11-02 ENCOUNTER — Other Ambulatory Visit (HOSPITAL_BASED_OUTPATIENT_CLINIC_OR_DEPARTMENT_OTHER): Payer: Self-pay

## 2020-11-02 DIAGNOSIS — F419 Anxiety disorder, unspecified: Secondary | ICD-10-CM

## 2020-11-02 MED ORDER — SERTRALINE HCL 50 MG PO TABS
ORAL_TABLET | Freq: Every day | ORAL | 1 refills | Status: DC
  Filled 2020-11-02: qty 90, 90d supply, fill #0
  Filled 2021-02-04: qty 90, 90d supply, fill #1

## 2020-11-02 NOTE — Telephone Encounter (Signed)
UTD on visits, please escribe. Thanks!

## 2020-11-05 ENCOUNTER — Other Ambulatory Visit (HOSPITAL_BASED_OUTPATIENT_CLINIC_OR_DEPARTMENT_OTHER): Payer: Self-pay

## 2020-11-10 DIAGNOSIS — M4126 Other idiopathic scoliosis, lumbar region: Secondary | ICD-10-CM | POA: Diagnosis not present

## 2020-11-10 DIAGNOSIS — M5442 Lumbago with sciatica, left side: Secondary | ICD-10-CM | POA: Diagnosis not present

## 2020-11-10 DIAGNOSIS — M9902 Segmental and somatic dysfunction of thoracic region: Secondary | ICD-10-CM | POA: Diagnosis not present

## 2020-11-10 DIAGNOSIS — M9901 Segmental and somatic dysfunction of cervical region: Secondary | ICD-10-CM | POA: Diagnosis not present

## 2020-11-10 DIAGNOSIS — M5137 Other intervertebral disc degeneration, lumbosacral region: Secondary | ICD-10-CM | POA: Diagnosis not present

## 2020-11-10 DIAGNOSIS — M9903 Segmental and somatic dysfunction of lumbar region: Secondary | ICD-10-CM | POA: Diagnosis not present

## 2020-11-10 DIAGNOSIS — M9904 Segmental and somatic dysfunction of sacral region: Secondary | ICD-10-CM | POA: Diagnosis not present

## 2020-11-10 DIAGNOSIS — M40202 Unspecified kyphosis, cervical region: Secondary | ICD-10-CM | POA: Diagnosis not present

## 2020-11-10 DIAGNOSIS — M5441 Lumbago with sciatica, right side: Secondary | ICD-10-CM | POA: Diagnosis not present

## 2020-11-17 ENCOUNTER — Other Ambulatory Visit (HOSPITAL_BASED_OUTPATIENT_CLINIC_OR_DEPARTMENT_OTHER): Payer: Self-pay

## 2020-11-22 DIAGNOSIS — M9904 Segmental and somatic dysfunction of sacral region: Secondary | ICD-10-CM | POA: Diagnosis not present

## 2020-11-22 DIAGNOSIS — M5441 Lumbago with sciatica, right side: Secondary | ICD-10-CM | POA: Diagnosis not present

## 2020-11-22 DIAGNOSIS — M40202 Unspecified kyphosis, cervical region: Secondary | ICD-10-CM | POA: Diagnosis not present

## 2020-11-22 DIAGNOSIS — M9901 Segmental and somatic dysfunction of cervical region: Secondary | ICD-10-CM | POA: Diagnosis not present

## 2020-11-22 DIAGNOSIS — M5137 Other intervertebral disc degeneration, lumbosacral region: Secondary | ICD-10-CM | POA: Diagnosis not present

## 2020-11-22 DIAGNOSIS — M5442 Lumbago with sciatica, left side: Secondary | ICD-10-CM | POA: Diagnosis not present

## 2020-11-22 DIAGNOSIS — M9902 Segmental and somatic dysfunction of thoracic region: Secondary | ICD-10-CM | POA: Diagnosis not present

## 2020-11-22 DIAGNOSIS — M4126 Other idiopathic scoliosis, lumbar region: Secondary | ICD-10-CM | POA: Diagnosis not present

## 2020-11-22 DIAGNOSIS — M9903 Segmental and somatic dysfunction of lumbar region: Secondary | ICD-10-CM | POA: Diagnosis not present

## 2020-11-24 ENCOUNTER — Other Ambulatory Visit (HOSPITAL_BASED_OUTPATIENT_CLINIC_OR_DEPARTMENT_OTHER): Payer: Self-pay

## 2020-12-22 DIAGNOSIS — M9903 Segmental and somatic dysfunction of lumbar region: Secondary | ICD-10-CM | POA: Diagnosis not present

## 2020-12-22 DIAGNOSIS — M5441 Lumbago with sciatica, right side: Secondary | ICD-10-CM | POA: Diagnosis not present

## 2020-12-22 DIAGNOSIS — M40202 Unspecified kyphosis, cervical region: Secondary | ICD-10-CM | POA: Diagnosis not present

## 2020-12-22 DIAGNOSIS — M5442 Lumbago with sciatica, left side: Secondary | ICD-10-CM | POA: Diagnosis not present

## 2020-12-22 DIAGNOSIS — M9902 Segmental and somatic dysfunction of thoracic region: Secondary | ICD-10-CM | POA: Diagnosis not present

## 2020-12-22 DIAGNOSIS — M9901 Segmental and somatic dysfunction of cervical region: Secondary | ICD-10-CM | POA: Diagnosis not present

## 2020-12-22 DIAGNOSIS — M5137 Other intervertebral disc degeneration, lumbosacral region: Secondary | ICD-10-CM | POA: Diagnosis not present

## 2020-12-22 DIAGNOSIS — M9904 Segmental and somatic dysfunction of sacral region: Secondary | ICD-10-CM | POA: Diagnosis not present

## 2020-12-22 DIAGNOSIS — M4126 Other idiopathic scoliosis, lumbar region: Secondary | ICD-10-CM | POA: Diagnosis not present

## 2020-12-28 DIAGNOSIS — M4126 Other idiopathic scoliosis, lumbar region: Secondary | ICD-10-CM | POA: Diagnosis not present

## 2020-12-28 DIAGNOSIS — M9902 Segmental and somatic dysfunction of thoracic region: Secondary | ICD-10-CM | POA: Diagnosis not present

## 2020-12-28 DIAGNOSIS — M9903 Segmental and somatic dysfunction of lumbar region: Secondary | ICD-10-CM | POA: Diagnosis not present

## 2020-12-28 DIAGNOSIS — M5137 Other intervertebral disc degeneration, lumbosacral region: Secondary | ICD-10-CM | POA: Diagnosis not present

## 2020-12-28 DIAGNOSIS — M40202 Unspecified kyphosis, cervical region: Secondary | ICD-10-CM | POA: Diagnosis not present

## 2020-12-28 DIAGNOSIS — M5442 Lumbago with sciatica, left side: Secondary | ICD-10-CM | POA: Diagnosis not present

## 2020-12-28 DIAGNOSIS — M9904 Segmental and somatic dysfunction of sacral region: Secondary | ICD-10-CM | POA: Diagnosis not present

## 2020-12-28 DIAGNOSIS — M9901 Segmental and somatic dysfunction of cervical region: Secondary | ICD-10-CM | POA: Diagnosis not present

## 2020-12-28 DIAGNOSIS — M5441 Lumbago with sciatica, right side: Secondary | ICD-10-CM | POA: Diagnosis not present

## 2021-01-01 ENCOUNTER — Telehealth: Payer: 59 | Admitting: Nurse Practitioner

## 2021-01-01 DIAGNOSIS — U071 COVID-19: Secondary | ICD-10-CM

## 2021-01-01 MED ORDER — NIRMATRELVIR/RITONAVIR (PAXLOVID)TABLET: 3.0000 | ORAL_TABLET | Freq: Two times a day (BID) | ORAL | 0 refills | Status: AC

## 2021-01-01 NOTE — Progress Notes (Signed)
Virtual Visit Consent   Miguel Love, you are scheduled for a virtual visit with a [facility] provider today.     Just as with appointments in the office, your consent must be obtained to participate.  Your consent will be active for this visit and any virtual visit you may have with one of our providers in the next 365 days.     If you have a MyChart account, a copy of this consent can be sent to you electronically.  All virtual visits are billed to your insurance company just like a traditional visit in the office.    As this is a virtual visit, video technology does not allow for your provider to perform a traditional examination.  This may limit your provider's ability to fully assess your condition.  If your provider identifies any concerns that need to be evaluated in person or the need to arrange testing (such as labs, EKG, etc.), we will make arrangements to do so.     Although advances in technology are sophisticated, we cannot ensure that it will always work on either your end or our end.  If the connection with a video visit is poor, the visit may have to be switched to a telephone visit.  With either a video or telephone visit, we are not always able to ensure that we have a secure connection.     I need to obtain your verbal consent now.   Are you willing to proceed with your visit today?    Miguel Love has provided verbal consent on 01/01/2021 for a virtual visit (video or telephone).   Apolonio Schneiders, FNP   Date: 01/01/2021 11:30 AM   Virtual Visit via Video Note   I, Apolonio Schneiders, connected with  Miguel Love  ([id number], 11-18-1990) on 01/01/21 at 11:30 AM EST by a video-enabled telemedicine application and verified that I am speaking with the correct person using two identifiers.  Location: Patient: Virtual Visit Location Patient: Home Provider: Virtual Visit Location Provider: Home Office   I discussed the limitations of evaluation and management by telemedicine  and the availability of in person appointments. The patient expressed understanding and agreed to proceed.    History of Present Illness: Miguel Love is a 30 y.o. who identifies as a male who was assigned male at birth, and is being seen today after testing positive for COVID yesterday.   Symptom onset was yesterday as well with dry cough and some nasal congestion.   He has had COVID once in the past prior to vaccination.  He was sick for 7-10 days without complications  He has since been vaccinated X 3 without most recent booster.    He does have a history of asthma- still uses a rescue inhaler with URI Started last night and used once this am  GFR in June was 114 HPI: HPI  Problems:  Patient Active Problem List   Diagnosis Date Noted   History of COVID-19 07/04/2019   IBS (irritable bowel syndrome) 07/20/2014   HTN (hypertension) 10/04/2012   General medical examination 06/29/2011   Childhood asthma 05/15/2011   Elevated urine levels of catecholamines 05/15/2011   Anxiety 05/15/2011   Weight loss 05/15/2011    Allergies:  Allergies  Allergen Reactions   Nsaids Shortness Of Breath    Asthma attack   Medications:  Current Outpatient Medications:    albuterol (VENTOLIN HFA) 108 (90 Base) MCG/ACT inhaler, Inhale 2 puffs into the lungs every 6 (  six) hours as needed for wheezing or shortness of breath., Disp: 8.5 g, Rfl: 0   amoxicillin-clavulanate (AUGMENTIN) 875-125 MG tablet, Take 1 tablet by mouth 2 (two) times daily., Disp: 14 tablet, Rfl: 0   fluticasone (FLONASE) 50 MCG/ACT nasal spray, PLACE 2 SPRAYS INTO BOTH NOSTRILS DAILY, Disp: 16 g, Rfl: 3   lidocaine (XYLOCAINE) 2 % solution, Use as directed 15 mLs in the mouth or throat as needed for mouth pain., Disp: 300 mL, Rfl: 0   loratadine (CLARITIN) 10 MG tablet, Take 1 tablet (10 mg total) by mouth daily., Disp: 90 tablet, Rfl: 3   metoprolol succinate (TOPROL-XL) 25 MG 24 hr tablet, TAKE 1 TABLET (25 MG TOTAL) BY  MOUTH DAILY., Disp: 90 tablet, Rfl: 1   predniSONE (DELTASONE) 10 MG tablet, Take 2 tablets (20 mg total) by mouth daily., Disp: 15 tablet, Rfl: 0   sertraline (ZOLOFT) 50 MG tablet, TAKE 1 TABLET BY MOUTH ONCE DAILY, Disp: 90 tablet, Rfl: 1   zinc gluconate 50 MG tablet, Take 50 mg by mouth daily., Disp: , Rfl:   Observations/Objective: Patient is well-developed, well-nourished in no acute distress.  Resting comfortably at home.  Head is normocephalic, atraumatic.  No labored breathing.  Speech is clear and coherent with logical content.  Patient is alert and oriented at baseline.   Assessment and Plan: 1. COVID-19 Take anti-viral with food   - nirmatrelvir/ritonavir EUA (PAXLOVID) 20 x 150 MG & 10 x 100MG  TABS; Take 3 tablets by mouth 2 (two) times daily for 5 days. (Take nirmatrelvir 150 mg two tablets twice daily for 5 days and ritonavir 100 mg one tablet twice daily for 5 days) Patient GFR is 114  Dispense: 30 tablet; Refill: 0       Continue over the counter medication medications as discussed and albuterol   Follow Up Instructions: I discussed the assessment and treatment plan with the patient. The patient was provided an opportunity to ask questions and all were answered. The patient agreed with the plan and demonstrated an understanding of the instructions.  A copy of instructions were sent to the patient via MyChart unless otherwise noted below.    The patient was advised to call back or seek an in-person evaluation if the symptoms worsen or if the condition fails to improve as anticipated.  Time:  I spent 15 minutes with the patient via telehealth technology discussing the above problems/concerns.    Apolonio Schneiders, FNP

## 2021-01-11 DIAGNOSIS — M40202 Unspecified kyphosis, cervical region: Secondary | ICD-10-CM | POA: Diagnosis not present

## 2021-01-11 DIAGNOSIS — M4126 Other idiopathic scoliosis, lumbar region: Secondary | ICD-10-CM | POA: Diagnosis not present

## 2021-01-11 DIAGNOSIS — M9904 Segmental and somatic dysfunction of sacral region: Secondary | ICD-10-CM | POA: Diagnosis not present

## 2021-01-11 DIAGNOSIS — M9901 Segmental and somatic dysfunction of cervical region: Secondary | ICD-10-CM | POA: Diagnosis not present

## 2021-01-11 DIAGNOSIS — M5442 Lumbago with sciatica, left side: Secondary | ICD-10-CM | POA: Diagnosis not present

## 2021-01-11 DIAGNOSIS — M9903 Segmental and somatic dysfunction of lumbar region: Secondary | ICD-10-CM | POA: Diagnosis not present

## 2021-01-11 DIAGNOSIS — M9902 Segmental and somatic dysfunction of thoracic region: Secondary | ICD-10-CM | POA: Diagnosis not present

## 2021-01-11 DIAGNOSIS — M5441 Lumbago with sciatica, right side: Secondary | ICD-10-CM | POA: Diagnosis not present

## 2021-01-11 DIAGNOSIS — M5137 Other intervertebral disc degeneration, lumbosacral region: Secondary | ICD-10-CM | POA: Diagnosis not present

## 2021-02-04 ENCOUNTER — Other Ambulatory Visit (HOSPITAL_BASED_OUTPATIENT_CLINIC_OR_DEPARTMENT_OTHER): Payer: Self-pay

## 2021-02-15 DIAGNOSIS — M40202 Unspecified kyphosis, cervical region: Secondary | ICD-10-CM | POA: Diagnosis not present

## 2021-02-15 DIAGNOSIS — M9904 Segmental and somatic dysfunction of sacral region: Secondary | ICD-10-CM | POA: Diagnosis not present

## 2021-02-15 DIAGNOSIS — M5137 Other intervertebral disc degeneration, lumbosacral region: Secondary | ICD-10-CM | POA: Diagnosis not present

## 2021-02-15 DIAGNOSIS — M5442 Lumbago with sciatica, left side: Secondary | ICD-10-CM | POA: Diagnosis not present

## 2021-02-15 DIAGNOSIS — M9901 Segmental and somatic dysfunction of cervical region: Secondary | ICD-10-CM | POA: Diagnosis not present

## 2021-02-15 DIAGNOSIS — M5441 Lumbago with sciatica, right side: Secondary | ICD-10-CM | POA: Diagnosis not present

## 2021-02-15 DIAGNOSIS — M9902 Segmental and somatic dysfunction of thoracic region: Secondary | ICD-10-CM | POA: Diagnosis not present

## 2021-02-15 DIAGNOSIS — M9903 Segmental and somatic dysfunction of lumbar region: Secondary | ICD-10-CM | POA: Diagnosis not present

## 2021-02-15 DIAGNOSIS — M4126 Other idiopathic scoliosis, lumbar region: Secondary | ICD-10-CM | POA: Diagnosis not present

## 2021-02-24 DIAGNOSIS — M9904 Segmental and somatic dysfunction of sacral region: Secondary | ICD-10-CM | POA: Diagnosis not present

## 2021-02-24 DIAGNOSIS — M40202 Unspecified kyphosis, cervical region: Secondary | ICD-10-CM | POA: Diagnosis not present

## 2021-02-24 DIAGNOSIS — M9902 Segmental and somatic dysfunction of thoracic region: Secondary | ICD-10-CM | POA: Diagnosis not present

## 2021-02-24 DIAGNOSIS — M4126 Other idiopathic scoliosis, lumbar region: Secondary | ICD-10-CM | POA: Diagnosis not present

## 2021-02-24 DIAGNOSIS — M9903 Segmental and somatic dysfunction of lumbar region: Secondary | ICD-10-CM | POA: Diagnosis not present

## 2021-02-24 DIAGNOSIS — M5442 Lumbago with sciatica, left side: Secondary | ICD-10-CM | POA: Diagnosis not present

## 2021-02-24 DIAGNOSIS — M9901 Segmental and somatic dysfunction of cervical region: Secondary | ICD-10-CM | POA: Diagnosis not present

## 2021-02-24 DIAGNOSIS — M5441 Lumbago with sciatica, right side: Secondary | ICD-10-CM | POA: Diagnosis not present

## 2021-02-24 DIAGNOSIS — M5137 Other intervertebral disc degeneration, lumbosacral region: Secondary | ICD-10-CM | POA: Diagnosis not present

## 2021-03-01 DIAGNOSIS — M9901 Segmental and somatic dysfunction of cervical region: Secondary | ICD-10-CM | POA: Diagnosis not present

## 2021-03-01 DIAGNOSIS — M9903 Segmental and somatic dysfunction of lumbar region: Secondary | ICD-10-CM | POA: Diagnosis not present

## 2021-03-01 DIAGNOSIS — M9904 Segmental and somatic dysfunction of sacral region: Secondary | ICD-10-CM | POA: Diagnosis not present

## 2021-03-01 DIAGNOSIS — M40202 Unspecified kyphosis, cervical region: Secondary | ICD-10-CM | POA: Diagnosis not present

## 2021-03-01 DIAGNOSIS — M4126 Other idiopathic scoliosis, lumbar region: Secondary | ICD-10-CM | POA: Diagnosis not present

## 2021-03-01 DIAGNOSIS — M5442 Lumbago with sciatica, left side: Secondary | ICD-10-CM | POA: Diagnosis not present

## 2021-03-01 DIAGNOSIS — M5441 Lumbago with sciatica, right side: Secondary | ICD-10-CM | POA: Diagnosis not present

## 2021-03-01 DIAGNOSIS — M9902 Segmental and somatic dysfunction of thoracic region: Secondary | ICD-10-CM | POA: Diagnosis not present

## 2021-03-01 DIAGNOSIS — M5137 Other intervertebral disc degeneration, lumbosacral region: Secondary | ICD-10-CM | POA: Diagnosis not present

## 2021-03-31 DIAGNOSIS — M5441 Lumbago with sciatica, right side: Secondary | ICD-10-CM | POA: Diagnosis not present

## 2021-03-31 DIAGNOSIS — M9904 Segmental and somatic dysfunction of sacral region: Secondary | ICD-10-CM | POA: Diagnosis not present

## 2021-03-31 DIAGNOSIS — M9901 Segmental and somatic dysfunction of cervical region: Secondary | ICD-10-CM | POA: Diagnosis not present

## 2021-03-31 DIAGNOSIS — M5442 Lumbago with sciatica, left side: Secondary | ICD-10-CM | POA: Diagnosis not present

## 2021-03-31 DIAGNOSIS — M9903 Segmental and somatic dysfunction of lumbar region: Secondary | ICD-10-CM | POA: Diagnosis not present

## 2021-03-31 DIAGNOSIS — M40202 Unspecified kyphosis, cervical region: Secondary | ICD-10-CM | POA: Diagnosis not present

## 2021-03-31 DIAGNOSIS — M4126 Other idiopathic scoliosis, lumbar region: Secondary | ICD-10-CM | POA: Diagnosis not present

## 2021-03-31 DIAGNOSIS — M9902 Segmental and somatic dysfunction of thoracic region: Secondary | ICD-10-CM | POA: Diagnosis not present

## 2021-03-31 DIAGNOSIS — M5137 Other intervertebral disc degeneration, lumbosacral region: Secondary | ICD-10-CM | POA: Diagnosis not present

## 2021-04-21 DIAGNOSIS — M9903 Segmental and somatic dysfunction of lumbar region: Secondary | ICD-10-CM | POA: Diagnosis not present

## 2021-04-21 DIAGNOSIS — M9902 Segmental and somatic dysfunction of thoracic region: Secondary | ICD-10-CM | POA: Diagnosis not present

## 2021-04-21 DIAGNOSIS — M5441 Lumbago with sciatica, right side: Secondary | ICD-10-CM | POA: Diagnosis not present

## 2021-04-21 DIAGNOSIS — M4126 Other idiopathic scoliosis, lumbar region: Secondary | ICD-10-CM | POA: Diagnosis not present

## 2021-04-21 DIAGNOSIS — M5442 Lumbago with sciatica, left side: Secondary | ICD-10-CM | POA: Diagnosis not present

## 2021-04-21 DIAGNOSIS — M9904 Segmental and somatic dysfunction of sacral region: Secondary | ICD-10-CM | POA: Diagnosis not present

## 2021-04-21 DIAGNOSIS — M9901 Segmental and somatic dysfunction of cervical region: Secondary | ICD-10-CM | POA: Diagnosis not present

## 2021-04-21 DIAGNOSIS — M5137 Other intervertebral disc degeneration, lumbosacral region: Secondary | ICD-10-CM | POA: Diagnosis not present

## 2021-04-21 DIAGNOSIS — M40202 Unspecified kyphosis, cervical region: Secondary | ICD-10-CM | POA: Diagnosis not present

## 2021-05-04 ENCOUNTER — Other Ambulatory Visit: Payer: Self-pay | Admitting: Family Medicine

## 2021-05-04 ENCOUNTER — Other Ambulatory Visit (HOSPITAL_BASED_OUTPATIENT_CLINIC_OR_DEPARTMENT_OTHER): Payer: Self-pay

## 2021-05-04 DIAGNOSIS — F419 Anxiety disorder, unspecified: Secondary | ICD-10-CM

## 2021-05-04 MED ORDER — METOPROLOL SUCCINATE ER 25 MG PO TB24
ORAL_TABLET | Freq: Every day | ORAL | 1 refills | Status: DC
  Filled 2021-05-04: qty 90, 90d supply, fill #0

## 2021-05-04 MED ORDER — SERTRALINE HCL 50 MG PO TABS
ORAL_TABLET | Freq: Every day | ORAL | 1 refills | Status: DC
  Filled 2021-05-04: qty 90, 90d supply, fill #0

## 2021-05-04 MED ORDER — ALBUTEROL SULFATE HFA 108 (90 BASE) MCG/ACT IN AERS
2.0000 | INHALATION_SPRAY | Freq: Four times a day (QID) | RESPIRATORY_TRACT | 0 refills | Status: DC | PRN
  Filled 2021-05-04: qty 8.5, 25d supply, fill #0

## 2021-05-05 ENCOUNTER — Other Ambulatory Visit (HOSPITAL_BASED_OUTPATIENT_CLINIC_OR_DEPARTMENT_OTHER): Payer: Self-pay

## 2021-05-05 DIAGNOSIS — M9904 Segmental and somatic dysfunction of sacral region: Secondary | ICD-10-CM | POA: Diagnosis not present

## 2021-05-05 DIAGNOSIS — M9901 Segmental and somatic dysfunction of cervical region: Secondary | ICD-10-CM | POA: Diagnosis not present

## 2021-05-05 DIAGNOSIS — M5441 Lumbago with sciatica, right side: Secondary | ICD-10-CM | POA: Diagnosis not present

## 2021-05-05 DIAGNOSIS — M4126 Other idiopathic scoliosis, lumbar region: Secondary | ICD-10-CM | POA: Diagnosis not present

## 2021-05-05 DIAGNOSIS — M9902 Segmental and somatic dysfunction of thoracic region: Secondary | ICD-10-CM | POA: Diagnosis not present

## 2021-05-05 DIAGNOSIS — M40202 Unspecified kyphosis, cervical region: Secondary | ICD-10-CM | POA: Diagnosis not present

## 2021-05-05 DIAGNOSIS — M9903 Segmental and somatic dysfunction of lumbar region: Secondary | ICD-10-CM | POA: Diagnosis not present

## 2021-05-05 DIAGNOSIS — M5442 Lumbago with sciatica, left side: Secondary | ICD-10-CM | POA: Diagnosis not present

## 2021-05-05 DIAGNOSIS — M5137 Other intervertebral disc degeneration, lumbosacral region: Secondary | ICD-10-CM | POA: Diagnosis not present

## 2021-05-09 ENCOUNTER — Encounter: Payer: Self-pay | Admitting: Family Medicine

## 2021-05-09 ENCOUNTER — Other Ambulatory Visit (HOSPITAL_BASED_OUTPATIENT_CLINIC_OR_DEPARTMENT_OTHER): Payer: Self-pay

## 2021-05-09 ENCOUNTER — Ambulatory Visit: Payer: 59 | Admitting: Family Medicine

## 2021-05-09 ENCOUNTER — Telehealth: Payer: Self-pay | Admitting: Family Medicine

## 2021-05-09 VITALS — BP 128/80 | HR 70 | Temp 98.6°F | Resp 16 | Ht 65.0 in | Wt 151.8 lb

## 2021-05-09 DIAGNOSIS — M5442 Lumbago with sciatica, left side: Secondary | ICD-10-CM

## 2021-05-09 DIAGNOSIS — M5441 Lumbago with sciatica, right side: Secondary | ICD-10-CM | POA: Diagnosis not present

## 2021-05-09 MED ORDER — PREDNISONE 10 MG PO TABS
ORAL_TABLET | ORAL | 0 refills | Status: DC
  Filled 2021-05-09: qty 18, 9d supply, fill #0

## 2021-05-09 MED ORDER — METHOCARBAMOL 500 MG PO TABS
500.0000 mg | ORAL_TABLET | Freq: Three times a day (TID) | ORAL | 0 refills | Status: DC | PRN
  Filled 2021-05-09: qty 45, 15d supply, fill #0

## 2021-05-09 NOTE — Patient Instructions (Signed)
Follow up as needed or as scheduled ?START the Prednisone as directed- take w/ food ?USE the muscle relaxers as needed- particularly at night ?HEAT! ?Call with any questions or concerns ?Hang in there!!! ?

## 2021-05-09 NOTE — Telephone Encounter (Signed)
LM for pt to call back to schedule an appt  ? ? ? ? ?Chief Complaint Back Injury ?Reason for Call Request to Schedule Office Appointment ?Initial Comment Caller asks if he can be seen in office tomorrow ?(by anyone availalbe). (He is Therapist, sports at [employer]). He is ?having lower back pain, muscle tear, sciatica pain, ?stiffness for about a week. ?Translation No ?Nurse Assessment ?Nurse: Dierdre Harness, RN, Nofa Date/Time (Eastern Time): 05/08/2021 2:50:55 PM ?Confirm and document reason for call. If ?symptomatic, describe symptoms. ?---Caller asks if he can be seen in office tomorrow (by ?anyone available). 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 Caller states he sees a ?chiropractor and has been attempting home care with ?no relief of symptoms. ?Does the patient have any new or worsening ?symptoms? ---Yes ?Will a triage be completed? ---Yes ?Related visit to physician within the last 2 weeks? ---No ?Does the PT have any chronic conditions? (i.e. ?diabetes, asthma, this includes High risk factors for ?pregnancy, etc.) ?---Yes ?List chronic conditions. ---asthma, anxiety, prehypertension ?Is this a behavioral health or substance abuse call? ---No ?PLEASE NOTE: All timestamps contained within this report are represented as [place] Standard Time. ?CONFIDENTIALTY NOTICE: This fax transmission is intended only for the addressee. It contains information that is legally privileged, confidential or ?otherwise protected from use or disclosure. If you are not the intended recipient, you are strictly prohibited from reviewing, disclosing, copying using ?or disseminating any of this information or taking any action in reliance on or regarding this information. If you have received this fax in error, please ?notify us immediately by telephone so that we can arrange for its return to [place]. Phone: [phone], Toll-Free: [phone], Fax: [phone] ?Page: 2 of 2 ?Call  Id: [id number] ?Guidelines ?Guideline Title Affirmed Question Affirmed Notes Nurse Date/Time (Eastern ?Time) ?Back Injury [1] After 3 days (72 ?hours) AND [2] back ?pain not improving ?[place], [place], [place] 05/08/2021 2:59:51 ?PM ?Disp. Time (Eastern ?Time) Disposition Final User ?05/08/2021 3:10:42 PM SEE PCP WITHIN 3 DAYS Yes Kadour, RN, Nofa ?Caller Disagree/Comply Comply ?Caller Understands Yes ?PreDisposition Home Care ?Care Advice Given Per Guideline ?SEE PCP WITHIN 3 DAYS: CALL BACK IF: * You become worse * Movement of the back is generally more healing in the long ?term than rest. * Continue ordinary activities as much as your pain permits. AVOID: * Avoid heavy lifting and any sports activities ?for the first week after an injury. * This will help increase blood flow and improve healing. * ACETAMINOPHEN - REGULAR ?STRENGTH TYLENOL: Take 650 mg (two 325 mg pills) by mouth every 4 to 6 hours as needed. Each Regular Strength Tylenol ?pill has 325 mg of acetaminophen. The most you should take is 10 pills a day (3,250 mg total). Note: In [place], the maximum is 12 ?pills a day (3,900 mg total). * ACETAMINOPHEN - EXTRA STRENGTH TYLENOL: Take 1,000 mg (two 500 mg pills) every 6 ?to 8 hours as needed. Each Extra Strength Tylenol pill has 500 mg of acetaminophen. The most you should take is 6 pills a day (3,000 ?mg total). Note: In [place], the maximum is 8 pills a day (4,000 mg total). CARE ADVICE given per Back Injury (Adult) guideline. 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

## 2021-05-09 NOTE — Progress Notes (Signed)
? ?  Subjective:  ? ? Patient ID: Miguel Love, male    DOB: 12-14-1990, 31 y.o.   MRN: [id number] ? ?HPI ?Back pain- sxs started 5 days ago.  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  Has stiffness to either side of spine from T12 down to lumbar spine.  L>R.  Got a massage on Friday w/o relief.  Has tried heat, Epsom salt soaks, leftover hydrocodone, ES Tylenol.  All provided temporary and partial relief.  Pain is worse w/ sitting and has radiating pain into LEs at times.  No numbness/tingling, no bowel or bladder incontinence.   ? ? ?Review of Systems ?For ROS see HPI  ?   ?Objective:  ? Physical Exam ?Vitals reviewed.  ?Constitutional:   ?   General: He is not in acute distress. ?   Appearance: Normal appearance. He is not ill-appearing.  ?HENT:  ?   Head: Normocephalic and atraumatic.  ?Cardiovascular:  ?   Pulses: Normal pulses.  ?Musculoskeletal:     ?   General: Tenderness (TTP over bilateral paraspinal muscles w/ obvious spasm) present.  ?   Comments: No TTP over vertebrae  ?Skin: ?   General: Skin is warm and dry.  ?Neurological:  ?   General: No focal deficit present.  ?   Mental Status: He is alert and oriented to person, place, and time.  ?   Cranial Nerves: No cranial nerve deficit.  ?   Sensory: No sensory deficit.  ?   Motor: No weakness.  ?   Coordination: Coordination normal.  ?   Gait: Gait normal.  ?   Deep Tendon Reflexes: Reflexes normal.  ?   Comments: (-) SLR bilaterally  ? ? ? ? ? ?   ?Assessment & Plan:  ?Bilateral low back pain- new.  Suspect a strain from work that resulted in paraspinal spasm.  Start Prednisone taper since pt is intolerant to NSAIDs and add muscle relaxer.  Reviewed supportive care and red flags that should prompt return.  Pt expressed understanding and is in agreement w/ plan.  ? ?

## 2021-05-17 DIAGNOSIS — M4126 Other idiopathic scoliosis, lumbar region: Secondary | ICD-10-CM | POA: Diagnosis not present

## 2021-05-17 DIAGNOSIS — M9904 Segmental and somatic dysfunction of sacral region: Secondary | ICD-10-CM | POA: Diagnosis not present

## 2021-05-17 DIAGNOSIS — M5442 Lumbago with sciatica, left side: Secondary | ICD-10-CM | POA: Diagnosis not present

## 2021-05-17 DIAGNOSIS — M9903 Segmental and somatic dysfunction of lumbar region: Secondary | ICD-10-CM | POA: Diagnosis not present

## 2021-05-17 DIAGNOSIS — M5441 Lumbago with sciatica, right side: Secondary | ICD-10-CM | POA: Diagnosis not present

## 2021-05-17 DIAGNOSIS — M5137 Other intervertebral disc degeneration, lumbosacral region: Secondary | ICD-10-CM | POA: Diagnosis not present

## 2021-05-17 DIAGNOSIS — M9902 Segmental and somatic dysfunction of thoracic region: Secondary | ICD-10-CM | POA: Diagnosis not present

## 2021-05-17 DIAGNOSIS — M40202 Unspecified kyphosis, cervical region: Secondary | ICD-10-CM | POA: Diagnosis not present

## 2021-05-17 DIAGNOSIS — M9901 Segmental and somatic dysfunction of cervical region: Secondary | ICD-10-CM | POA: Diagnosis not present

## 2021-05-24 DIAGNOSIS — M9901 Segmental and somatic dysfunction of cervical region: Secondary | ICD-10-CM | POA: Diagnosis not present

## 2021-05-24 DIAGNOSIS — M5137 Other intervertebral disc degeneration, lumbosacral region: Secondary | ICD-10-CM | POA: Diagnosis not present

## 2021-05-24 DIAGNOSIS — M5442 Lumbago with sciatica, left side: Secondary | ICD-10-CM | POA: Diagnosis not present

## 2021-05-24 DIAGNOSIS — M40202 Unspecified kyphosis, cervical region: Secondary | ICD-10-CM | POA: Diagnosis not present

## 2021-05-24 DIAGNOSIS — M9902 Segmental and somatic dysfunction of thoracic region: Secondary | ICD-10-CM | POA: Diagnosis not present

## 2021-05-24 DIAGNOSIS — M5441 Lumbago with sciatica, right side: Secondary | ICD-10-CM | POA: Diagnosis not present

## 2021-05-24 DIAGNOSIS — M4126 Other idiopathic scoliosis, lumbar region: Secondary | ICD-10-CM | POA: Diagnosis not present

## 2021-05-24 DIAGNOSIS — M9904 Segmental and somatic dysfunction of sacral region: Secondary | ICD-10-CM | POA: Diagnosis not present

## 2021-05-24 DIAGNOSIS — M9903 Segmental and somatic dysfunction of lumbar region: Secondary | ICD-10-CM | POA: Diagnosis not present

## 2021-06-07 DIAGNOSIS — M5442 Lumbago with sciatica, left side: Secondary | ICD-10-CM | POA: Diagnosis not present

## 2021-06-07 DIAGNOSIS — M9901 Segmental and somatic dysfunction of cervical region: Secondary | ICD-10-CM | POA: Diagnosis not present

## 2021-06-07 DIAGNOSIS — M5441 Lumbago with sciatica, right side: Secondary | ICD-10-CM | POA: Diagnosis not present

## 2021-06-07 DIAGNOSIS — M9902 Segmental and somatic dysfunction of thoracic region: Secondary | ICD-10-CM | POA: Diagnosis not present

## 2021-06-07 DIAGNOSIS — M9904 Segmental and somatic dysfunction of sacral region: Secondary | ICD-10-CM | POA: Diagnosis not present

## 2021-06-07 DIAGNOSIS — M9903 Segmental and somatic dysfunction of lumbar region: Secondary | ICD-10-CM | POA: Diagnosis not present

## 2021-06-07 DIAGNOSIS — M5137 Other intervertebral disc degeneration, lumbosacral region: Secondary | ICD-10-CM | POA: Diagnosis not present

## 2021-06-07 DIAGNOSIS — M40202 Unspecified kyphosis, cervical region: Secondary | ICD-10-CM | POA: Diagnosis not present

## 2021-06-07 DIAGNOSIS — M4126 Other idiopathic scoliosis, lumbar region: Secondary | ICD-10-CM | POA: Diagnosis not present

## 2021-06-13 DIAGNOSIS — M5137 Other intervertebral disc degeneration, lumbosacral region: Secondary | ICD-10-CM | POA: Diagnosis not present

## 2021-06-13 DIAGNOSIS — M9903 Segmental and somatic dysfunction of lumbar region: Secondary | ICD-10-CM | POA: Diagnosis not present

## 2021-06-13 DIAGNOSIS — M9902 Segmental and somatic dysfunction of thoracic region: Secondary | ICD-10-CM | POA: Diagnosis not present

## 2021-06-13 DIAGNOSIS — M4126 Other idiopathic scoliosis, lumbar region: Secondary | ICD-10-CM | POA: Diagnosis not present

## 2021-06-13 DIAGNOSIS — M40202 Unspecified kyphosis, cervical region: Secondary | ICD-10-CM | POA: Diagnosis not present

## 2021-06-13 DIAGNOSIS — M9904 Segmental and somatic dysfunction of sacral region: Secondary | ICD-10-CM | POA: Diagnosis not present

## 2021-06-13 DIAGNOSIS — M5441 Lumbago with sciatica, right side: Secondary | ICD-10-CM | POA: Diagnosis not present

## 2021-06-13 DIAGNOSIS — M5442 Lumbago with sciatica, left side: Secondary | ICD-10-CM | POA: Diagnosis not present

## 2021-06-13 DIAGNOSIS — M9901 Segmental and somatic dysfunction of cervical region: Secondary | ICD-10-CM | POA: Diagnosis not present

## 2021-06-14 DIAGNOSIS — H52203 Unspecified astigmatism, bilateral: Secondary | ICD-10-CM | POA: Diagnosis not present

## 2021-06-14 DIAGNOSIS — H5213 Myopia, bilateral: Secondary | ICD-10-CM | POA: Diagnosis not present

## 2021-06-21 DIAGNOSIS — M5137 Other intervertebral disc degeneration, lumbosacral region: Secondary | ICD-10-CM | POA: Diagnosis not present

## 2021-06-21 DIAGNOSIS — M9903 Segmental and somatic dysfunction of lumbar region: Secondary | ICD-10-CM | POA: Diagnosis not present

## 2021-06-21 DIAGNOSIS — M4126 Other idiopathic scoliosis, lumbar region: Secondary | ICD-10-CM | POA: Diagnosis not present

## 2021-06-21 DIAGNOSIS — M40202 Unspecified kyphosis, cervical region: Secondary | ICD-10-CM | POA: Diagnosis not present

## 2021-06-21 DIAGNOSIS — M9901 Segmental and somatic dysfunction of cervical region: Secondary | ICD-10-CM | POA: Diagnosis not present

## 2021-06-21 DIAGNOSIS — M5442 Lumbago with sciatica, left side: Secondary | ICD-10-CM | POA: Diagnosis not present

## 2021-06-21 DIAGNOSIS — M5441 Lumbago with sciatica, right side: Secondary | ICD-10-CM | POA: Diagnosis not present

## 2021-06-21 DIAGNOSIS — M9902 Segmental and somatic dysfunction of thoracic region: Secondary | ICD-10-CM | POA: Diagnosis not present

## 2021-06-21 DIAGNOSIS — M9904 Segmental and somatic dysfunction of sacral region: Secondary | ICD-10-CM | POA: Diagnosis not present

## 2021-07-13 DIAGNOSIS — M9901 Segmental and somatic dysfunction of cervical region: Secondary | ICD-10-CM | POA: Diagnosis not present

## 2021-07-13 DIAGNOSIS — M40202 Unspecified kyphosis, cervical region: Secondary | ICD-10-CM | POA: Diagnosis not present

## 2021-07-13 DIAGNOSIS — M5442 Lumbago with sciatica, left side: Secondary | ICD-10-CM | POA: Diagnosis not present

## 2021-07-13 DIAGNOSIS — M5137 Other intervertebral disc degeneration, lumbosacral region: Secondary | ICD-10-CM | POA: Diagnosis not present

## 2021-07-13 DIAGNOSIS — M5441 Lumbago with sciatica, right side: Secondary | ICD-10-CM | POA: Diagnosis not present

## 2021-07-13 DIAGNOSIS — M9902 Segmental and somatic dysfunction of thoracic region: Secondary | ICD-10-CM | POA: Diagnosis not present

## 2021-07-13 DIAGNOSIS — M9903 Segmental and somatic dysfunction of lumbar region: Secondary | ICD-10-CM | POA: Diagnosis not present

## 2021-07-13 DIAGNOSIS — M4126 Other idiopathic scoliosis, lumbar region: Secondary | ICD-10-CM | POA: Diagnosis not present

## 2021-07-13 DIAGNOSIS — M9904 Segmental and somatic dysfunction of sacral region: Secondary | ICD-10-CM | POA: Diagnosis not present

## 2021-07-18 DIAGNOSIS — M4126 Other idiopathic scoliosis, lumbar region: Secondary | ICD-10-CM | POA: Diagnosis not present

## 2021-07-18 DIAGNOSIS — M9902 Segmental and somatic dysfunction of thoracic region: Secondary | ICD-10-CM | POA: Diagnosis not present

## 2021-07-18 DIAGNOSIS — M9904 Segmental and somatic dysfunction of sacral region: Secondary | ICD-10-CM | POA: Diagnosis not present

## 2021-07-18 DIAGNOSIS — M40202 Unspecified kyphosis, cervical region: Secondary | ICD-10-CM | POA: Diagnosis not present

## 2021-07-18 DIAGNOSIS — M9901 Segmental and somatic dysfunction of cervical region: Secondary | ICD-10-CM | POA: Diagnosis not present

## 2021-07-18 DIAGNOSIS — M5441 Lumbago with sciatica, right side: Secondary | ICD-10-CM | POA: Diagnosis not present

## 2021-07-18 DIAGNOSIS — M5442 Lumbago with sciatica, left side: Secondary | ICD-10-CM | POA: Diagnosis not present

## 2021-07-18 DIAGNOSIS — M9903 Segmental and somatic dysfunction of lumbar region: Secondary | ICD-10-CM | POA: Diagnosis not present

## 2021-07-18 DIAGNOSIS — M5137 Other intervertebral disc degeneration, lumbosacral region: Secondary | ICD-10-CM | POA: Diagnosis not present

## 2021-07-25 ENCOUNTER — Encounter: Payer: Self-pay | Admitting: Family Medicine

## 2021-07-25 ENCOUNTER — Ambulatory Visit (INDEPENDENT_AMBULATORY_CARE_PROVIDER_SITE_OTHER): Payer: 59 | Admitting: Family Medicine

## 2021-07-25 ENCOUNTER — Other Ambulatory Visit (HOSPITAL_BASED_OUTPATIENT_CLINIC_OR_DEPARTMENT_OTHER): Payer: Self-pay

## 2021-07-25 VITALS — BP 136/82 | HR 92 | Temp 99.1°F | Resp 16 | Ht 65.0 in | Wt 153.0 lb

## 2021-07-25 DIAGNOSIS — R7989 Other specified abnormal findings of blood chemistry: Secondary | ICD-10-CM

## 2021-07-25 DIAGNOSIS — F419 Anxiety disorder, unspecified: Secondary | ICD-10-CM

## 2021-07-25 DIAGNOSIS — Z0000 Encounter for general adult medical examination without abnormal findings: Secondary | ICD-10-CM | POA: Diagnosis not present

## 2021-07-25 DIAGNOSIS — I10 Essential (primary) hypertension: Secondary | ICD-10-CM

## 2021-07-25 LAB — HEPATIC FUNCTION PANEL
ALT: 118 U/L — ABNORMAL HIGH (ref 0–53)
AST: 65 U/L — ABNORMAL HIGH (ref 0–37)
Albumin: 4.9 g/dL (ref 3.5–5.2)
Alkaline Phosphatase: 65 U/L (ref 39–117)
Bilirubin, Direct: 0.1 mg/dL (ref 0.0–0.3)
Total Bilirubin: 0.6 mg/dL (ref 0.2–1.2)
Total Protein: 7.5 g/dL (ref 6.0–8.3)

## 2021-07-25 LAB — CBC WITH DIFFERENTIAL/PLATELET
Basophils Absolute: 0 10*3/uL (ref 0.0–0.1)
Basophils Relative: 0.7 % (ref 0.0–3.0)
Eosinophils Absolute: 0.1 10*3/uL (ref 0.0–0.7)
Eosinophils Relative: 2.4 % (ref 0.0–5.0)
HCT: 48.8 % (ref 39.0–52.0)
Hemoglobin: 16.9 g/dL (ref 13.0–17.0)
Lymphocytes Relative: 35.4 % (ref 12.0–46.0)
Lymphs Abs: 1.4 10*3/uL (ref 0.7–4.0)
MCHC: 34.7 g/dL (ref 30.0–36.0)
MCV: 86.8 fl (ref 78.0–100.0)
Monocytes Absolute: 0.3 10*3/uL (ref 0.1–1.0)
Monocytes Relative: 7.2 % (ref 3.0–12.0)
Neutro Abs: 2.1 10*3/uL (ref 1.4–7.7)
Neutrophils Relative %: 54.3 % (ref 43.0–77.0)
Platelets: 197 10*3/uL (ref 150.0–400.0)
RBC: 5.62 Mil/uL (ref 4.22–5.81)
RDW: 12.6 % (ref 11.5–15.5)
WBC: 3.9 10*3/uL — ABNORMAL LOW (ref 4.0–10.5)

## 2021-07-25 LAB — LIPID PANEL
Cholesterol: 154 mg/dL (ref 0–200)
HDL: 57.7 mg/dL (ref >39.00)
LDL Cholesterol: 83 mg/dL (ref 0–99)
NonHDL: 96.63
Total CHOL/HDL Ratio: 3
Triglycerides: 70 mg/dL (ref 0.0–149.0)
VLDL: 14 mg/dL (ref 0.0–40.0)

## 2021-07-25 LAB — BASIC METABOLIC PANEL
BUN: 15 mg/dL (ref 6–23)
CO2: 27 mEq/L (ref 19–32)
Calcium: 10 mg/dL (ref 8.4–10.5)
Chloride: 101 mEq/L (ref 96–112)
Creatinine, Ser: 0.97 mg/dL (ref 0.40–1.50)
GFR: 102.84 mL/min (ref >60.00)
Glucose, Bld: 111 mg/dL — ABNORMAL HIGH (ref 70–99)
Potassium: 3.9 mEq/L (ref 3.5–5.1)
Sodium: 140 mEq/L (ref 135–145)

## 2021-07-25 LAB — TSH: TSH: 2.26 u[IU]/mL (ref 0.35–5.50)

## 2021-07-25 MED ORDER — SERTRALINE HCL 100 MG PO TABS
100.0000 mg | ORAL_TABLET | Freq: Every day | ORAL | 1 refills | Status: DC
  Filled 2021-07-25: qty 90, 90d supply, fill #0

## 2021-07-25 NOTE — Assessment & Plan Note (Signed)
Chronic problem.  Currently well controlled.  No med changes at this time

## 2021-07-25 NOTE — Progress Notes (Signed)
   Subjective:    Patient ID: Miguel Love, male    DOB: 1990/07/14, 31 y.o.   MRN: [id number]  HPI CPE- UTD on Tdap  Health Maintenance  Topic Date Due   INFLUENZA VACCINE  08/09/2021   TETANUS/TDAP  06/24/2031   Hepatitis C Screening  Completed   HIV Screening  Completed   HPV VACCINES  Aged Out   COVID-19 Vaccine  Discontinued      Review of Systems Patient reports no vision/hearing changes, anorexia, fever ,adenopathy, persistant/recurrent hoarseness, swallowing issues, chest pain, palpitations, edema, persistant/recurrent cough, hemoptysis, dyspnea (rest,exertional, paroxysmal nocturnal), gastrointestinal  bleeding (melena, rectal bleeding), abdominal pain, excessive heart burn, GU symptoms (dysuria, hematuria, voiding/incontinence issues) syncope, focal weakness, memory loss, numbness & tingling, skin/hair/nail changes, depression, abnormal bruising/bleeding, musculoskeletal symptoms/signs.   + anxiety- pt reports increased sxs recently w/ work and school.  Occurs intermittently.  Currently on Sertraline '50mg'$  daily.    Objective:   Physical Exam General Appearance:    Alert, cooperative, no distress, appears stated age  Head:    Normocephalic, without obvious abnormality, atraumatic  Eyes:    PERRL, conjunctiva/corneas clear, EOM's intact both eyes       Ears:    Normal TM's and external ear canals, both ears  Nose:   Nares normal, septum midline, mucosa normal, no drainage   or sinus tenderness  Throat:   Lips, mucosa, and tongue normal; teeth and gums normal  Neck:   Supple, symmetrical, trachea midline, no adenopathy;       thyroid:  No enlargement/tenderness/nodules  Back:     Symmetric, no curvature, ROM normal, no CVA tenderness  Lungs:     Clear to auscultation bilaterally, respirations unlabored  Chest wall:    No tenderness or deformity  Heart:    Regular rate and rhythm, S1 and S2 normal, no murmur, rub   or gallop  Abdomen:     Soft, non-tender, bowel sounds  active all four quadrants,    no masses, no organomegaly  Genitalia:    deferred  Rectal:    Extremities:   Extremities normal, atraumatic, no cyanosis or edema  Pulses:   2+ and symmetric all extremities  Skin:   Skin color, texture, turgor normal, no rashes or lesions  Lymph nodes:   Cervical, supraclavicular, and axillary nodes normal  Neurologic:   CNII-XII intact. Normal strength, sensation and reflexes      throughout          Assessment & Plan:

## 2021-07-25 NOTE — Patient Instructions (Addendum)
Follow up in 1 year or as needed We'll notify you of your lab results and make any changes if needed Keep up the good work on healthy diet and regular exercise- you're doing great! Increase Sertraline to '100mg'$  daily- 2 of what you have and 1 of the new prescription Call with any questions or concerns Have a great summer!

## 2021-07-25 NOTE — Assessment & Plan Note (Signed)
Deteriorated.  Will increase Sertraline to '100mg'$  daily.  Pt expressed understanding and is in agreement w/ plan.

## 2021-07-25 NOTE — Assessment & Plan Note (Signed)
Pt's PE WNL.  UTD on Tdap.  Check labs.  Anticipatory guidance provided.  

## 2021-07-26 ENCOUNTER — Telehealth: Payer: Self-pay

## 2021-07-26 NOTE — Telephone Encounter (Signed)
-----   Message from Midge Minium, MD sent at 07/26/2021  7:18 AM EDT ----- ALT and AST are both higher than last check (about double).  Please hold tylenol and alcohol x2 weeks and we will repeat your liver functions at a lab only visit (ordered)  Remainder of labs look good!

## 2021-07-26 NOTE — Telephone Encounter (Signed)
Pt called back and he made his lab only visit

## 2021-07-26 NOTE — Addendum Note (Signed)
Addended by: Midge Minium on: 07/26/2021 07:18 AM   Modules accepted: Orders

## 2021-07-27 ENCOUNTER — Other Ambulatory Visit (HOSPITAL_BASED_OUTPATIENT_CLINIC_OR_DEPARTMENT_OTHER): Payer: Self-pay

## 2021-07-27 MED ORDER — NEBIVOLOL HCL 5 MG PO TABS
5.0000 mg | ORAL_TABLET | Freq: Every day | ORAL | 3 refills | Status: DC
  Filled 2021-07-27: qty 30, 30d supply, fill #0
  Filled 2021-08-26: qty 30, 30d supply, fill #1
  Filled 2021-09-23: qty 30, 30d supply, fill #2
  Filled 2021-10-25: qty 30, 30d supply, fill #3

## 2021-07-27 MED ORDER — FLUOXETINE HCL 40 MG PO CAPS
40.0000 mg | ORAL_CAPSULE | Freq: Every day | ORAL | 3 refills | Status: DC
  Filled 2021-07-27: qty 30, 30d supply, fill #0
  Filled 2021-08-26: qty 30, 30d supply, fill #1
  Filled 2021-09-23: qty 30, 30d supply, fill #2
  Filled 2021-10-25: qty 30, 30d supply, fill #3

## 2021-07-28 ENCOUNTER — Other Ambulatory Visit (HOSPITAL_BASED_OUTPATIENT_CLINIC_OR_DEPARTMENT_OTHER): Payer: Self-pay

## 2021-08-09 ENCOUNTER — Other Ambulatory Visit (INDEPENDENT_AMBULATORY_CARE_PROVIDER_SITE_OTHER): Payer: 59

## 2021-08-09 DIAGNOSIS — R7989 Other specified abnormal findings of blood chemistry: Secondary | ICD-10-CM

## 2021-08-09 LAB — HEPATIC FUNCTION PANEL
ALT: 52 U/L (ref 0–53)
AST: 26 U/L (ref 0–37)
Albumin: 4.6 g/dL (ref 3.5–5.2)
Alkaline Phosphatase: 54 U/L (ref 39–117)
Bilirubin, Direct: 0.2 mg/dL (ref 0.0–0.3)
Total Bilirubin: 0.8 mg/dL (ref 0.2–1.2)
Total Protein: 7.3 g/dL (ref 6.0–8.3)

## 2021-08-10 NOTE — Progress Notes (Signed)
Left pt a vm in regards to lab results  

## 2021-08-25 ENCOUNTER — Ambulatory Visit: Payer: 59 | Admitting: Dermatology

## 2021-08-25 ENCOUNTER — Encounter: Payer: Self-pay | Admitting: Dermatology

## 2021-08-25 DIAGNOSIS — M40202 Unspecified kyphosis, cervical region: Secondary | ICD-10-CM | POA: Diagnosis not present

## 2021-08-25 DIAGNOSIS — Z1283 Encounter for screening for malignant neoplasm of skin: Secondary | ICD-10-CM

## 2021-08-25 DIAGNOSIS — M5442 Lumbago with sciatica, left side: Secondary | ICD-10-CM | POA: Diagnosis not present

## 2021-08-25 DIAGNOSIS — D2271 Melanocytic nevi of right lower limb, including hip: Secondary | ICD-10-CM | POA: Diagnosis not present

## 2021-08-25 DIAGNOSIS — D1800 Hemangioma unspecified site: Secondary | ICD-10-CM

## 2021-08-25 DIAGNOSIS — M9903 Segmental and somatic dysfunction of lumbar region: Secondary | ICD-10-CM | POA: Diagnosis not present

## 2021-08-25 DIAGNOSIS — L649 Androgenic alopecia, unspecified: Secondary | ICD-10-CM

## 2021-08-25 DIAGNOSIS — L821 Other seborrheic keratosis: Secondary | ICD-10-CM | POA: Diagnosis not present

## 2021-08-25 DIAGNOSIS — M5441 Lumbago with sciatica, right side: Secondary | ICD-10-CM | POA: Diagnosis not present

## 2021-08-25 DIAGNOSIS — L814 Other melanin hyperpigmentation: Secondary | ICD-10-CM | POA: Diagnosis not present

## 2021-08-25 DIAGNOSIS — M5137 Other intervertebral disc degeneration, lumbosacral region: Secondary | ICD-10-CM | POA: Diagnosis not present

## 2021-08-25 DIAGNOSIS — Z808 Family history of malignant neoplasm of other organs or systems: Secondary | ICD-10-CM | POA: Diagnosis not present

## 2021-08-25 DIAGNOSIS — M9902 Segmental and somatic dysfunction of thoracic region: Secondary | ICD-10-CM | POA: Diagnosis not present

## 2021-08-25 DIAGNOSIS — L578 Other skin changes due to chronic exposure to nonionizing radiation: Secondary | ICD-10-CM | POA: Diagnosis not present

## 2021-08-25 DIAGNOSIS — M4126 Other idiopathic scoliosis, lumbar region: Secondary | ICD-10-CM | POA: Diagnosis not present

## 2021-08-25 DIAGNOSIS — M9904 Segmental and somatic dysfunction of sacral region: Secondary | ICD-10-CM | POA: Diagnosis not present

## 2021-08-25 DIAGNOSIS — M9901 Segmental and somatic dysfunction of cervical region: Secondary | ICD-10-CM | POA: Diagnosis not present

## 2021-08-25 DIAGNOSIS — D229 Melanocytic nevi, unspecified: Secondary | ICD-10-CM

## 2021-08-25 NOTE — Patient Instructions (Signed)
Recommend daily broad spectrum sunscreen SPF 30+ to sun-exposed areas, reapply every 2 hours as needed. Call for new or changing lesions.  Staying in the shade or wearing long sleeves, sun glasses (UVA+UVB protection) and wide brim hats (4-inch brim around the entire circumference of the hat) are also recommended for sun protection.    Recommend taking Heliocare sun protection supplement daily in sunny weather for additional sun protection. For maximum protection on the sunniest days, you can take up to 2 capsules of regular Heliocare OR take 1 capsule of Heliocare Ultra. For prolonged exposure (such as a full day in the sun), you can repeat your dose of the supplement 4 hours after your first dose. Heliocare can be purchased at [facility], at some [facility] or at www.heliocare.com.    Melanoma ABCDEs  Melanoma is the most dangerous type of skin cancer, and is the leading cause of death from skin disease.  You are more likely to develop melanoma if you: Have light-colored skin, light-colored eyes, or red or blond hair Spend a lot of time in the sun Tan regularly, either outdoors or in a tanning bed Have had blistering sunburns, especially during childhood Have a close family member who has had a melanoma Have atypical moles or large birthmarks  Early detection of melanoma is key since treatment is typically straightforward and cure rates are extremely high if we catch it early.   The first sign of melanoma is often a change in a mole or a new dark spot.  The ABCDE system is a way of remembering the signs of melanoma.  A for asymmetry:  The two halves do not match. B for border:  The edges of the growth are irregular. C for color:  A mixture of colors are present instead of an even brown color. D for diameter:  Melanomas are usually (but not always) greater than 6mm - the size of a pencil eraser. E for evolution:  The spot keeps changing in size, shape, and color.  Please check  your skin once per month between visits. You can use a small mirror in front and a large mirror behind you to keep an eye on the back side or your body.   If you see any new or changing lesions before your next follow-up, please call to schedule a visit.  Please continue daily skin protection including broad spectrum sunscreen SPF 30+ to sun-exposed areas, reapplying every 2 hours as needed when you're outdoors.   Staying in the shade or wearing long sleeves, sun glasses (UVA+UVB protection) and wide brim hats (4-inch brim around the entire circumference of the hat) are also recommended for sun protection.    Due to recent changes in healthcare laws, you may see results of your pathology and/or laboratory studies on MyChart before the doctors have had a chance to review them. We understand that in some cases there may be results that are confusing or concerning to you. Please understand that not all results are received at the same time and often the doctors may need to interpret multiple results in order to provide you with the best plan of care or course of treatment. Therefore, we ask that you please give us 2 business days to thoroughly review all your results before contacting the office for clarification. Should we see a critical lab result, you will be contacted sooner.   If You Need Anything After Your Visit  If you have any questions or concerns for your doctor, please   call our main line at [phone] and press option 4 to reach your doctor's medical assistant. If no one answers, please leave a voicemail as directed and we will return your call as soon as possible. Messages left after 4 pm will be answered the following business day.   You may also send us a message via MyChart. We typically respond to MyChart messages within 1-2 business days.  For prescription refills, please ask your pharmacy to contact our office. Our fax number is [phone].  If you have an urgent issue when the  clinic is closed that cannot wait until the next business day, you can page your doctor at the number below.    Please note that while we do our best to be available for urgent issues outside of office hours, we are not available 24/7.   If you have an urgent issue and are unable to reach us, you may choose to seek medical care at your doctor's office, retail clinic, urgent care center, or emergency room.  If you have a medical emergency, please immediately call 911 or go to the emergency department.  Pager Numbers  - Dr. Kowalski: [phone]  - Dr. Moye: [phone]  - Dr. Stewart: [phone]  In the event of inclement weather, please call our main line at [phone] for an update on the status of any delays or closures.  Dermatology Medication Tips: Please keep the boxes that topical medications come in in order to help keep track of the instructions about where and how to use these. Pharmacies typically print the medication instructions only on the boxes and not directly on the medication tubes.   If your medication is too expensive, please contact our office at [phone] option 4 or send us a message through MyChart.   We are unable to tell what your co-pay for medications will be in advance as this is different depending on your insurance coverage. However, we may be able to find a substitute medication at lower cost or fill out paperwork to get insurance to cover a needed medication.   If a prior authorization is required to get your medication covered by your insurance company, please allow us 1-2 business days to complete this process.  Drug prices often vary depending on where the prescription is filled and some pharmacies may offer cheaper prices.  The website www.goodrx.com contains coupons for medications through different pharmacies. The prices here do not account for what the cost may be with help from insurance (it may be cheaper with your insurance), but the  website can give you the price if you did not use any insurance.  - You can print the associated coupon and take it with your prescription to the pharmacy.  - You may also stop by our office during regular business hours and pick up a GoodRx coupon card.  - If you need your prescription sent electronically to a different pharmacy, notify our office through [facility] MyChart or by phone at [phone] option 4.     Si Usted Necesita Algo Despus de Su Visita  Tambin puede enviarnos un mensaje a travs de MyChart. Por lo general respondemos a los mensajes de MyChart en el transcurso de 1 a 2 das hbiles.  Para renovar recetas, por favor pida a su farmacia que se ponga en contacto con nuestra oficina. Nuestro nmero de fax es el [phone].  Si tiene un asunto urgente cuando la clnica est cerrada y que no puede esperar hasta el siguiente da hbil,   puede llamar/localizar a su doctor(a) al nmero que aparece a continuacin.   Por favor, tenga en cuenta que aunque hacemos todo lo posible para estar disponibles para asuntos urgentes fuera del horario de oficina, no estamos disponibles las 24 horas del da, los 7 das de la semana.   Si tiene un problema urgente y no puede comunicarse con nosotros, puede optar por buscar atencin mdica  en el consultorio de su doctor(a), en una clnica privada, en un centro de atencin urgente o en una sala de emergencias.  Si tiene una emergencia mdica, por favor llame inmediatamente al 911 o vaya a la sala de emergencias.  Nmeros de bper  - Dr. Kowalski: [phone]  - Dra. Moye: [phone]  - Dra. Stewart: [phone]  En caso de inclemencias del tiempo, por favor llame a nuestra lnea principal al [phone] para una actualizacin sobre el estado de cualquier retraso o cierre.  Consejos para la medicacin en dermatologa: Por favor, guarde las cajas en las que vienen los medicamentos de uso tpico para ayudarle a seguir las  instrucciones sobre dnde y cmo usarlos. Las farmacias generalmente imprimen las instrucciones del medicamento slo en las cajas y no directamente en los tubos del medicamento.   Si su medicamento es muy caro, por favor, pngase en contacto con nuestra oficina llamando al [phone] y presione la opcin 4 o envenos un mensaje a travs de MyChart.   No podemos decirle cul ser su copago por los medicamentos por adelantado ya que esto es diferente dependiendo de la cobertura de su seguro. Sin embargo, es posible que podamos encontrar un medicamento sustituto a menor costo o llenar un formulario para que el seguro cubra el medicamento que se considera necesario.   Si se requiere una autorizacin previa para que su compaa de seguros cubra su medicamento, por favor permtanos de 1 a 2 das hbiles para completar este proceso.  Los precios de los medicamentos varan con frecuencia dependiendo del lugar de dnde se surte la receta y alguna farmacias pueden ofrecer precios ms baratos.  El sitio web www.goodrx.com tiene cupones para medicamentos de diferentes farmacias. Los precios aqu no tienen en cuenta lo que podra costar con la ayuda del seguro (puede ser ms barato con su seguro), pero el sitio web puede darle el precio si no utiliz ningn seguro.  - Puede imprimir el cupn correspondiente y llevarlo con su receta a la farmacia.  - Tambin puede pasar por nuestra oficina durante el horario de atencin regular y recoger una tarjeta de cupones de GoodRx.  - Si necesita que su receta se enve electrnicamente a una farmacia diferente, informe a nuestra oficina a travs de MyChart de [facility] o por telfono llamando al [phone] y presione la opcin 4.  

## 2021-08-25 NOTE — Progress Notes (Signed)
   Follow-Up Visit   Subjective  Miguel Love is a 31 y.o. male who presents for the following: Annual Exam (No personal history of skin cancer or dysplastic nevi. Family H/O BCC. Areas of concern today. Hx of tanning bed use once when younger) and Alopecia (Has been using essential oils. Stopped, Nutrafol, was unable to get easily). The patient presents for Total-Body Skin Exam (TBSE) for skin cancer screening and mole check.  The patient has spots, moles and lesions to be evaluated, some may be new or changing and the patient has concerns that these could be cancer.   The following portions of the chart were reviewed this encounter and updated as appropriate:  Tobacco  Allergies  Meds  Problems  Med Hx  Surg Hx  Fam Hx      Review of Systems: No other skin or systemic complaints except as noted in HPI or Assessment and Plan.   Objective  Well appearing patient in no apparent distress; mood and affect are within normal limits.  A full examination was performed including scalp, head, eyes, ears, nose, lips, neck, chest, axillae, abdomen, back, buttocks, bilateral upper extremities, bilateral lower extremities, hands, feet, fingers, toes, fingernails, and toenails. All findings within normal limits unless otherwise noted below.  Right 1st-2nd Toe Web, Right 3rd Toe Right 1st-2nd Toe Web: 0.4cm dark brown macule Right 3rd Toe: 0.3cm brown macule  Scalp Diffuse thinning of hair at vertex or temples.    Assessment & Plan   Lentigines - Scattered tan macules - Due to sun exposure - Benign-appearing, observe - Recommend daily broad spectrum sunscreen SPF 30+ to sun-exposed areas, reapply every 2 hours as needed. - Call for any changes  Seborrheic Keratoses - Stuck-on, waxy, tan-brown papules and/or plaques  - Benign-appearing - Discussed benign etiology and prognosis. - Observe - Call for any changes  Melanocytic Nevi - Tan-brown and/or pink-flesh-colored symmetric  macules and papules - Benign appearing on exam today - Observation - Call clinic for new or changing moles - Recommend daily use of broad spectrum spf 30+ sunscreen to sun-exposed areas.   Hemangiomas - Red papules - Discussed benign nature - Observe - Call for any changes  Actinic Damage - Chronic condition, secondary to cumulative UV/sun exposure - diffuse scaly erythematous macules with underlying dyspigmentation - Recommend daily broad spectrum sunscreen SPF 30+ to sun-exposed areas, reapply every 2 hours as needed.  - Staying in the shade or wearing long sleeves, sun glasses (UVA+UVB protection) and wide brim hats (4-inch brim around the entire circumference of the hat) are also recommended for sun protection.  - Call for new or changing lesions.  Family history of skin cancer  - what type(s): BCC  - who affected: Father, paternal grandfather  Skin cancer screening performed today.  Nevus (2) Right 1st-2nd Toe Web; Right 3rd Toe  Benign-appearing.  Observation.  Call clinic for new or changing lesions.  Recommend daily use of broad spectrum spf 30+ sunscreen to sun-exposed areas.   No changes from last visit.   Androgenic alopecia Scalp  Satisfied with how he is currently doing with essential oils. Defer prescription treatment at this time.   Return for TBSE, Fm Hx BCC's, in 1-2 years.  I, Emelia Salisbury, CMA, am acting as scribe for Forest Gleason, MD.  Documentation: I have reviewed the above documentation for accuracy and completeness, and I agree with the above.  Forest Gleason, MD

## 2021-08-26 ENCOUNTER — Other Ambulatory Visit (HOSPITAL_BASED_OUTPATIENT_CLINIC_OR_DEPARTMENT_OTHER): Payer: Self-pay

## 2021-09-03 ENCOUNTER — Encounter: Payer: Self-pay | Admitting: Dermatology

## 2021-09-23 ENCOUNTER — Other Ambulatory Visit (HOSPITAL_BASED_OUTPATIENT_CLINIC_OR_DEPARTMENT_OTHER): Payer: Self-pay

## 2021-10-25 ENCOUNTER — Other Ambulatory Visit (HOSPITAL_BASED_OUTPATIENT_CLINIC_OR_DEPARTMENT_OTHER): Payer: Self-pay

## 2021-11-22 ENCOUNTER — Other Ambulatory Visit: Payer: Self-pay | Admitting: Family Medicine

## 2021-11-22 ENCOUNTER — Other Ambulatory Visit (HOSPITAL_BASED_OUTPATIENT_CLINIC_OR_DEPARTMENT_OTHER): Payer: Self-pay

## 2021-11-22 MED ORDER — FLUOXETINE HCL 40 MG PO CAPS
40.0000 mg | ORAL_CAPSULE | Freq: Every day | ORAL | 3 refills | Status: DC
  Filled 2021-11-22: qty 30, 30d supply, fill #0
  Filled 2021-12-21: qty 30, 30d supply, fill #1
  Filled 2022-01-27: qty 30, 30d supply, fill #2
  Filled 2022-02-22: qty 30, 30d supply, fill #3

## 2021-11-22 MED ORDER — NEBIVOLOL HCL 5 MG PO TABS
5.0000 mg | ORAL_TABLET | Freq: Every day | ORAL | 3 refills | Status: DC
  Filled 2021-11-22: qty 30, 30d supply, fill #0
  Filled 2022-01-27: qty 30, 30d supply, fill #1
  Filled 2022-02-22: qty 30, 30d supply, fill #2
  Filled 2022-03-22: qty 30, 30d supply, fill #3

## 2021-12-14 ENCOUNTER — Encounter: Payer: Self-pay | Admitting: Family Medicine

## 2021-12-14 ENCOUNTER — Other Ambulatory Visit (HOSPITAL_BASED_OUTPATIENT_CLINIC_OR_DEPARTMENT_OTHER): Payer: Self-pay

## 2021-12-14 ENCOUNTER — Ambulatory Visit: Payer: BC Managed Care – PPO | Admitting: Family Medicine

## 2021-12-14 VITALS — BP 118/80 | HR 81 | Temp 98.1°F | Resp 18 | Ht 65.0 in | Wt 157.0 lb

## 2021-12-14 DIAGNOSIS — J302 Other seasonal allergic rhinitis: Secondary | ICD-10-CM | POA: Diagnosis not present

## 2021-12-14 DIAGNOSIS — K2900 Acute gastritis without bleeding: Secondary | ICD-10-CM | POA: Diagnosis not present

## 2021-12-14 DIAGNOSIS — J45909 Unspecified asthma, uncomplicated: Secondary | ICD-10-CM

## 2021-12-14 DIAGNOSIS — K219 Gastro-esophageal reflux disease without esophagitis: Secondary | ICD-10-CM

## 2021-12-14 MED ORDER — FLUTICASONE PROPIONATE 50 MCG/ACT NA SUSP
2.0000 | Freq: Every day | NASAL | 3 refills | Status: DC
  Filled 2021-12-14: qty 16, 30d supply, fill #0

## 2021-12-14 MED ORDER — SUCRALFATE 1 G PO TABS
1.0000 g | ORAL_TABLET | Freq: Three times a day (TID) | ORAL | 1 refills | Status: DC
  Filled 2021-12-14: qty 90, 23d supply, fill #0

## 2021-12-14 MED ORDER — ALBUTEROL SULFATE HFA 108 (90 BASE) MCG/ACT IN AERS
2.0000 | INHALATION_SPRAY | Freq: Four times a day (QID) | RESPIRATORY_TRACT | 0 refills | Status: DC | PRN
  Filled 2021-12-14: qty 6.7, 25d supply, fill #0

## 2021-12-14 MED ORDER — PANTOPRAZOLE SODIUM 40 MG PO TBEC
40.0000 mg | DELAYED_RELEASE_TABLET | Freq: Every day | ORAL | 3 refills | Status: DC
  Filled 2021-12-14: qty 30, 30d supply, fill #0
  Filled 2022-01-10 (×2): qty 30, 30d supply, fill #1
  Filled 2022-02-13: qty 30, 30d supply, fill #2
  Filled 2022-04-28: qty 30, 30d supply, fill #3

## 2021-12-14 NOTE — Patient Instructions (Signed)
Follow up as needed or as scheduled START the Pantoprazole once daily- take through the holidays TAKE the Carafate before meals to help let the stomach heal Try and decrease caffeine and spicy foods until feeling better Call with any questions or concerns Stay Safe!  Stay Healthy! Happy Holidays!!!

## 2021-12-14 NOTE — Progress Notes (Signed)
   Subjective:    Patient ID: Miguel Love, male    DOB: 30-May-1990, 31 y.o.   MRN: [id number]  HPI GERD- pt reports increased burping and upset stomach.  Prominent over the last 2 weeks.  Some improvement w/ OTC Mylanta.  Tried Pepcid w/o relief.  Having some reflux upon waking in the morning.  Last week had 'gnawing and burning' in epigastric area.  Drinking unsweet tea.  Loves spicy food.   Review of Systems For ROS see HPI     Objective:   Physical Exam Vitals reviewed.  Constitutional:      General: He is not in acute distress.    Appearance: Normal appearance. He is not ill-appearing.  HENT:     Head: Normocephalic and atraumatic.  Eyes:     Extraocular Movements: Extraocular movements intact.     Conjunctiva/sclera: Conjunctivae normal.     Pupils: Pupils are equal, round, and reactive to light.  Skin:    General: Skin is warm and dry.  Neurological:     General: No focal deficit present.     Mental Status: He is alert and oriented to person, place, and time.  Psychiatric:        Mood and Affect: Mood normal.        Behavior: Behavior normal.        Thought Content: Thought content normal.           Assessment & Plan:  Gastritis/GERD- deteriorated.  Pt has hx of similar episode that responded well to PPI, Carafate, and diet modifications.  Just recently he has developed similar sxs.  He drinks a lot of tea, loves spicy food.  No alcohol or tobacco.  No relief w/ Pepcid, some relief w/ Mylanta.  Start Protonix '40mg'$  daily and add Carafate TID until feeling better.  Pt expressed understanding and is in agreement w/ plan.

## 2022-01-10 ENCOUNTER — Other Ambulatory Visit (HOSPITAL_BASED_OUTPATIENT_CLINIC_OR_DEPARTMENT_OTHER): Payer: Self-pay

## 2022-01-25 ENCOUNTER — Other Ambulatory Visit (HOSPITAL_BASED_OUTPATIENT_CLINIC_OR_DEPARTMENT_OTHER): Payer: Self-pay

## 2022-01-25 ENCOUNTER — Telehealth (INDEPENDENT_AMBULATORY_CARE_PROVIDER_SITE_OTHER): Payer: BC Managed Care – PPO | Admitting: Family Medicine

## 2022-01-25 ENCOUNTER — Encounter: Payer: Self-pay | Admitting: Family Medicine

## 2022-01-25 DIAGNOSIS — U071 COVID-19: Secondary | ICD-10-CM | POA: Diagnosis not present

## 2022-01-25 MED ORDER — PROMETHAZINE-DM 6.25-15 MG/5ML PO SYRP
5.0000 mL | ORAL_SOLUTION | Freq: Four times a day (QID) | ORAL | 0 refills | Status: DC | PRN
  Filled 2022-01-25: qty 118, 6d supply, fill #0

## 2022-01-25 MED ORDER — NIRMATRELVIR/RITONAVIR (PAXLOVID)TABLET
3.0000 | ORAL_TABLET | Freq: Two times a day (BID) | ORAL | 0 refills | Status: AC
  Filled 2022-01-25: qty 30, 5d supply, fill #0

## 2022-01-25 NOTE — Patient Instructions (Addendum)
Sorry you are sick.  I sent the Paxlovid to your pharmacy as well as the promethazine cough syrup if needed.  Continue fluids, rest and hope you feel better soon.  Albuterol if needed but as we discussed if that is required sooner than every 4 hours or persistent frequent need can look at other treatments.  Take care and let me know if there are questions.  Your employer may have specific requirements for return to work, but this information is provided from the CDC. There is a link provided below for more information if needed.   Everyone who has presumed or confirmed COVID-19 should stay home and isolate from other people for at least 5 full days (day 0 is the first day of symptoms or the date of the day of the positive viral test for asymptomatic persons). You can end isolation after 5 full days if you are fever-free for 24 hours without the use of fever-reducing medication and your other symptoms have improved (Loss of taste and smell may persist for weeks or months after recovery and need not delay the end of isolation). You should continue to wear a well-fitting mask around others at home and in public for 5 additional days (day 6 through day 10) after the end of your 5-day isolation period. If you are unable to wear a mask when around others, you should continue to isolate for a full 10 days. Avoid people who have weakened immune systems or are more likely to get very sick from COVID-19, and nursing homes and other high-risk settings, until after at least 10 days.  If you continue to have fever or your other symptoms have not improved after 5 days of isolation, you should wait to end your isolation until you are fever-free for 24 hours without the use of fever-reducing medication and your other symptoms have improved. Continue to wear a well-fitting mask through day 10.   https://brown.org/.html

## 2022-01-25 NOTE — Progress Notes (Signed)
Virtual Visit via Video Note  I connected with [name] on 01/25/22 at 9:35 AM by a video enabled telemedicine application and verified that I am speaking with the correct person using two identifiers.  Patient location: home, with wife - consent given to discuss PHI.  My location: office - [place].    I discussed the limitations, risks, security and privacy concerns of performing an evaluation and management service by telephone and the availability of in person appointments. I also discussed with the patient that there may be a patient responsible charge related to this service. The patient expressed understanding and agreed to proceed, consent obtained  Chief complaint:  Chief Complaint  Patient presents with   Covid Positive    Tested positive last night, congestion, cough, sore throat, brain fog    History of Present Illness: Miguel Love is a 32 y.o. male  Covid 39 infection: Initial sx's 3 days ago - sore throat. Wife also sick recently - she tested positive 2 days ago. Her sx's started earlier.  He had positive test last night.  No recent covid booster, 3 total vaccines.  Now with dry cough, some nasal, minimal chest congestion, some brain fog with current infection, off and on HA. Dry cough.  Sore to cough, but no chest pain.  Has used albuterol twice yesterday. Breathing ok today.  No dyspnea or chest pain. No confusion/disorientation.  Drinking fluids. Appetite is good.   Tx: promethazine cough syrup last night (wife's rx) helped, vicks, vaporub.  No relief with tessalon in past  GFR 102.84 in 07/2021. Would like to take paxlovid - worked well in past.  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    Patient Active Problem List   Diagnosis Date Noted   History of COVID-19 07/04/2019   IBS (irritable bowel syndrome) 07/20/2014   HTN (hypertension) 10/04/2012   General medical  examination 06/29/2011   Childhood asthma 05/15/2011   Elevated urine levels of catecholamines 05/15/2011   Anxiety 05/15/2011   Weight loss 05/15/2011   Past Medical History:  Diagnosis Date   Anxiety    Asthma    Cardiac arrhythmia due to congenital heart disease    pt denies 12/25/12   Gallbladder polyp    Hypertension    Palpitations    Past Surgical History:  Procedure Laterality Date   ANKLE ARTHROSCOPY WITH OPEN REDUCTION INTERNAL FIXATION (ORIF) Right 02/24/2020   Procedure: RIGHT ANKLE ARTHROSCOPIC DEBRIDEMENT, OPEN TREATMENT OF SYNDESMOSIS;  Surgeon: Erle Crocker, MD;  Location: [facility];  Service: Orthopedics;  Laterality: Right;  LENGTH OF SURGERY: 2 HOURS   WISDOM TOOTH EXTRACTION  2016   Allergies  Allergen Reactions   Nsaids Shortness Of Breath    Asthma attack   Prior to Admission medications   Medication Sig Start Date End Date Taking? Authorizing Provider  albuterol (VENTOLIN HFA) 108 (90 Base) MCG/ACT inhaler Inhale 2 puffs into the lungs every 6 (six) hours as needed for wheezing or shortness of breath. 12/14/21  Yes Midge Minium, MD  ascorbic acid (VITAMIN C) 500 MG tablet Take 500 mg by mouth daily.   Yes [provider]  FLUoxetine (PROZAC) 40 MG capsule Take 1 capsule (40 mg total) by mouth daily. 11/22/21  Yes Midge Minium, MD  fluticasone (FLONASE) 50 MCG/ACT nasal spray PLACE 2 SPRAYS INTO BOTH NOSTRILS DAILY 12/14/21  Yes Midge Minium, MD  loratadine [facility])  10 MG tablet Take 1 tablet (10 mg total) by mouth daily. 03/25/20  Yes Midge Minium, MD  nebivolol (BYSTOLIC) 5 MG tablet Take 1 tablet (5 mg total) by mouth daily. 11/22/21  Yes Midge Minium, MD  pantoprazole (PROTONIX) 40 MG tablet Take 1 tablet (40 mg total) by mouth daily. 12/14/21  Yes Midge Minium, MD  sucralfate (CARAFATE) 1 g tablet Take 1 tablet (1 g total) by mouth 4 (four) times daily -  with meals and at bedtime. Patient taking  differently: Take 1 g by mouth 4 (four) times daily -  with meals and at bedtime. PRN 12/14/21  Yes Midge Minium, MD  zinc gluconate 50 MG tablet Take 50 mg by mouth daily.   Yes [provider]   Social History   Socioeconomic History   Marital status: Single    Spouse name: Not on file   Number of children: 0   Years of education: Not on file   Highest education level: Not on file  Occupational History    Employer: [employer]  Tobacco Use   Smoking status: Never   Smokeless tobacco: Never  Vaping Use   Vaping Use: Never used  Substance and Sexual Activity   Alcohol use: Yes    Comment: occassional   Drug use: No   Sexual activity: Yes  Other Topics Concern   Not on file  Social History Narrative   Not on file   Social Determinants of Health   Financial Resource Strain: Not on file  Food Insecurity: Not on file  Transportation Needs: Not on file  Physical Activity: Not on file  Stress: Not on file  Social Connections: Not on file  Intimate Partner Violence: Not on file    Observations/Objective: There were no vitals filed for this visit. Nontoxic appearance on video, speaking in full sentences without respiratory distress, no audible wheeze or stridor.  Coherent responses.  All questions answered with understanding plan expressed.  Assessment and Plan: COVID-19 virus infection - Plan: promethazine-dextromethorphan (PROMETHAZINE-DM) 6.25-15 MG/5ML syrup, nirmatrelvir/ritonavir (PAXLOVID) 20 x 150 MG & 10 x '100MG'$  TABS Day 3 of COVID infection as above.  Requested antiviral, potential risks, side effects discussed.  Also discussed possible rebound COVID and treatment if that were to occur.  Start Paxlovid, full dose.  Promethazine cough syrup if needed.  Continue albuterol if needed infrequently for asthma but RTC/ER precautions given if frequent or persistent need.  Other symptomatic care discussed as well as isolation/masking precautions per CDC.  Follow  Up Instructions: As needed.   I discussed the assessment and treatment plan with the patient. The patient was provided an opportunity to ask questions and all were answered. The patient agreed with the plan and demonstrated an understanding of the instructions.   The patient was advised to call back or seek an in-person evaluation if the symptoms worsen or if the condition fails to improve as anticipated.   Wendie Agreste, MD

## 2022-01-27 ENCOUNTER — Other Ambulatory Visit (HOSPITAL_BASED_OUTPATIENT_CLINIC_OR_DEPARTMENT_OTHER): Payer: Self-pay

## 2022-02-22 ENCOUNTER — Other Ambulatory Visit (HOSPITAL_BASED_OUTPATIENT_CLINIC_OR_DEPARTMENT_OTHER): Payer: Self-pay

## 2022-03-22 ENCOUNTER — Other Ambulatory Visit: Payer: Self-pay | Admitting: Family Medicine

## 2022-03-22 ENCOUNTER — Other Ambulatory Visit (HOSPITAL_BASED_OUTPATIENT_CLINIC_OR_DEPARTMENT_OTHER): Payer: Self-pay

## 2022-03-22 MED ORDER — FLUOXETINE HCL 40 MG PO CAPS
40.0000 mg | ORAL_CAPSULE | Freq: Every day | ORAL | 3 refills | Status: DC
  Filled 2022-03-22: qty 30, 30d supply, fill #0
  Filled 2022-04-28: qty 30, 30d supply, fill #1
  Filled 2022-05-22: qty 30, 30d supply, fill #2
  Filled 2022-06-21: qty 30, 30d supply, fill #3

## 2022-03-23 ENCOUNTER — Other Ambulatory Visit (HOSPITAL_BASED_OUTPATIENT_CLINIC_OR_DEPARTMENT_OTHER): Payer: Self-pay

## 2022-04-28 ENCOUNTER — Other Ambulatory Visit: Payer: Self-pay | Admitting: Family Medicine

## 2022-04-28 ENCOUNTER — Other Ambulatory Visit (HOSPITAL_BASED_OUTPATIENT_CLINIC_OR_DEPARTMENT_OTHER): Payer: Self-pay

## 2022-04-28 MED ORDER — NEBIVOLOL HCL 5 MG PO TABS
5.0000 mg | ORAL_TABLET | Freq: Every day | ORAL | 3 refills | Status: DC
  Filled 2022-04-28: qty 30, 30d supply, fill #0
  Filled 2022-05-22: qty 30, 30d supply, fill #1
  Filled 2022-06-21: qty 30, 30d supply, fill #2
  Filled 2022-07-21: qty 30, 30d supply, fill #3

## 2022-07-21 ENCOUNTER — Other Ambulatory Visit (HOSPITAL_BASED_OUTPATIENT_CLINIC_OR_DEPARTMENT_OTHER): Payer: Self-pay

## 2022-07-21 ENCOUNTER — Other Ambulatory Visit: Payer: Self-pay | Admitting: Family Medicine

## 2022-07-21 ENCOUNTER — Other Ambulatory Visit: Payer: Self-pay

## 2022-07-21 MED ORDER — FLUOXETINE HCL 40 MG PO CAPS
40.0000 mg | ORAL_CAPSULE | Freq: Every day | ORAL | 3 refills | Status: DC
  Filled 2022-07-21: qty 30, 30d supply, fill #0
  Filled 2022-08-21 – 2022-09-05 (×2): qty 30, 30d supply, fill #1
  Filled 2022-09-28: qty 30, 30d supply, fill #2
  Filled 2022-10-30: qty 30, 30d supply, fill #3

## 2022-07-27 ENCOUNTER — Encounter: Payer: Self-pay | Admitting: Family Medicine

## 2022-07-27 ENCOUNTER — Ambulatory Visit (INDEPENDENT_AMBULATORY_CARE_PROVIDER_SITE_OTHER): Payer: BC Managed Care – PPO | Admitting: Family Medicine

## 2022-07-27 ENCOUNTER — Other Ambulatory Visit (HOSPITAL_BASED_OUTPATIENT_CLINIC_OR_DEPARTMENT_OTHER): Payer: Self-pay

## 2022-07-27 VITALS — BP 122/70 | HR 72 | Temp 98.1°F | Resp 17 | Ht 65.0 in | Wt 157.2 lb

## 2022-07-27 DIAGNOSIS — Z0000 Encounter for general adult medical examination without abnormal findings: Secondary | ICD-10-CM | POA: Diagnosis not present

## 2022-07-27 DIAGNOSIS — I10 Essential (primary) hypertension: Secondary | ICD-10-CM | POA: Diagnosis not present

## 2022-07-27 LAB — TSH: TSH: 2.07 u[IU]/mL (ref 0.35–5.50)

## 2022-07-27 LAB — BASIC METABOLIC PANEL
BUN: 15 mg/dL (ref 6–23)
CO2: 28 mEq/L (ref 19–32)
Calcium: 9.5 mg/dL (ref 8.4–10.5)
Chloride: 104 mEq/L (ref 96–112)
Creatinine, Ser: 0.98 mg/dL (ref 0.40–1.50)
GFR: 100.87 mL/min (ref >60.00)
Glucose, Bld: 99 mg/dL (ref 70–99)
Potassium: 3.4 mEq/L — ABNORMAL LOW (ref 3.5–5.1)
Sodium: 140 mEq/L (ref 135–145)

## 2022-07-27 LAB — LIPID PANEL
Cholesterol: 180 mg/dL (ref 0–200)
HDL: 52.9 mg/dL (ref >39.00)
LDL Cholesterol: 114 mg/dL — ABNORMAL HIGH (ref 0–99)
NonHDL: 127.27
Total CHOL/HDL Ratio: 3
Triglycerides: 64 mg/dL (ref 0.0–149.0)
VLDL: 12.8 mg/dL (ref 0.0–40.0)

## 2022-07-27 LAB — HEPATIC FUNCTION PANEL
ALT: 75 U/L — ABNORMAL HIGH (ref 0–53)
AST: 39 U/L — ABNORMAL HIGH (ref 0–37)
Albumin: 4.6 g/dL (ref 3.5–5.2)
Alkaline Phosphatase: 57 U/L (ref 39–117)
Bilirubin, Direct: 0.1 mg/dL (ref 0.0–0.3)
Total Bilirubin: 0.6 mg/dL (ref 0.2–1.2)
Total Protein: 7.2 g/dL (ref 6.0–8.3)

## 2022-07-27 LAB — CBC WITH DIFFERENTIAL/PLATELET
Basophils Absolute: 0 10*3/uL (ref 0.0–0.1)
Basophils Relative: 1.1 % (ref 0.0–3.0)
Eosinophils Absolute: 0.1 10*3/uL (ref 0.0–0.7)
Eosinophils Relative: 2.5 % (ref 0.0–5.0)
HCT: 47.8 % (ref 39.0–52.0)
Hemoglobin: 16.5 g/dL (ref 13.0–17.0)
Lymphocytes Relative: 43.4 % (ref 12.0–46.0)
Lymphs Abs: 1.6 10*3/uL (ref 0.7–4.0)
MCHC: 34.6 g/dL (ref 30.0–36.0)
MCV: 86.8 fl (ref 78.0–100.0)
Monocytes Absolute: 0.3 10*3/uL (ref 0.1–1.0)
Monocytes Relative: 8.1 % (ref 3.0–12.0)
Neutro Abs: 1.7 10*3/uL (ref 1.4–7.7)
Neutrophils Relative %: 44.9 % (ref 43.0–77.0)
Platelets: 200 10*3/uL (ref 150.0–400.0)
RBC: 5.5 Mil/uL (ref 4.22–5.81)
RDW: 12.3 % (ref 11.5–15.5)
WBC: 3.7 10*3/uL — ABNORMAL LOW (ref 4.0–10.5)

## 2022-07-27 MED ORDER — NEBIVOLOL HCL 5 MG PO TABS
5.0000 mg | ORAL_TABLET | Freq: Every day | ORAL | 1 refills | Status: DC
  Filled 2022-07-27: qty 90, 90d supply, fill #0
  Filled 2022-10-19: qty 90, 90d supply, fill #1

## 2022-07-27 NOTE — Progress Notes (Signed)
   Subjective:    Patient ID: Miguel Love, male    DOB: 24-Aug-1990, 32 y.o.   MRN: [id number]  HPI CPE- UTD on Tdap  Patient Care Team    Relationship Specialty Notifications Start End  Sheliah Hatch, MD PCP - General Family Medicine  05/15/11    Comment: Merged     Health Maintenance  Topic Date Due   INFLUENZA VACCINE  08/10/2022   DTaP/Tdap/Td (3 - Td or Tdap) 06/24/2031   Hepatitis C Screening  Completed   HIV Screening  Completed   HPV VACCINES  Aged Out   COVID-19 Vaccine  Discontinued      Review of Systems Patient reports no vision/hearing changes, anorexia, fever ,adenopathy, persistant/recurrent hoarseness, swallowing issues, chest pain, palpitations, edema, persistant/recurrent cough, hemoptysis, dyspnea (rest,exertional, paroxysmal nocturnal), gastrointestinal  bleeding (melena, rectal bleeding), abdominal pain, excessive heart burn, GU symptoms (dysuria, hematuria, voiding/incontinence issues) syncope, focal weakness, memory loss, numbness & tingling, skin/hair/nail changes, depression, anxiety, abnormal bruising/bleeding, musculoskeletal symptoms/signs.     Objective:   Physical Exam General Appearance:    Alert, cooperative, no distress, appears stated age  Head:    Normocephalic, without obvious abnormality, atraumatic  Eyes:    PERRL, conjunctiva/corneas clear, EOM's intact both eyes       Ears:    Normal TM's and external ear canals, both ears  Nose:   Nares normal, septum midline, mucosa normal, no drainage   or sinus tenderness  Throat:   Lips, mucosa, and tongue normal; teeth and gums normal  Neck:   Supple, symmetrical, trachea midline, no adenopathy;       thyroid:  No enlargement/tenderness/nodules  Back:     Symmetric, no curvature, ROM normal, no CVA tenderness  Lungs:     Clear to auscultation bilaterally, respirations unlabored  Chest wall:    No tenderness or deformity  Heart:    Regular rate and rhythm, S1 and S2 normal, no murmur, rub    or gallop  Abdomen:     Soft, non-tender, bowel sounds active all four quadrants,    no masses, no organomegaly  Genitalia:    deferred  Rectal:    Extremities:   Extremities normal, atraumatic, no cyanosis or edema  Pulses:   2+ and symmetric all extremities  Skin:   Skin color, texture, turgor normal, no rashes or lesions  Lymph nodes:   Cervical, supraclavicular, and axillary nodes normal  Neurologic:   CNII-XII intact. Normal strength, sensation and reflexes      throughout          Assessment & Plan:

## 2022-07-27 NOTE — Patient Instructions (Signed)
Follow up in 1 year or as needed We'll notify you of your lab results and make any changes if needed Continue to work on healthy diet and regular exercise- you're doing great! Call with any questions or concerns Stay Safe!  Stay Healthy! Have a great summer!!! 

## 2022-07-27 NOTE — Assessment & Plan Note (Signed)
Pt's PE WNL.  UTD on Tdap.  Check labs.  Anticipatory guidance provided.  

## 2022-07-28 ENCOUNTER — Telehealth: Payer: Self-pay

## 2022-07-28 ENCOUNTER — Other Ambulatory Visit: Payer: Self-pay

## 2022-07-28 DIAGNOSIS — R7989 Other specified abnormal findings of blood chemistry: Secondary | ICD-10-CM

## 2022-07-28 NOTE — Telephone Encounter (Signed)
Left pt a vm stating he needs to call for results .  Repeat LFT order is in and he will need a 2 week lab only visit made when he calls

## 2022-07-28 NOTE — Telephone Encounter (Signed)
Pt has seen his results Via my chart

## 2022-07-28 NOTE — Telephone Encounter (Signed)
-----   Message from Neena Rhymes sent at 07/28/2022  7:45 AM EDT ----- Potassium is just slightly low.  Increase your dietary intake in the form of leafy greens, citrus fruits, bananas.  Liver functions are again mildly elevated but not as high as they were.  Please hold tylenol and ibuprofen x2 weeks and we'll repeat your LFTs at a lab only visit (dx elevated LFTs)  Remainder of labs look great!  No changes at this time

## 2022-08-11 ENCOUNTER — Other Ambulatory Visit (INDEPENDENT_AMBULATORY_CARE_PROVIDER_SITE_OTHER): Payer: BC Managed Care – PPO

## 2022-08-11 ENCOUNTER — Telehealth: Payer: Self-pay

## 2022-08-11 DIAGNOSIS — R7989 Other specified abnormal findings of blood chemistry: Secondary | ICD-10-CM

## 2022-08-11 LAB — HEPATIC FUNCTION PANEL
ALT: 70 U/L — ABNORMAL HIGH (ref 0–53)
AST: 39 U/L — ABNORMAL HIGH (ref 0–37)
Albumin: 4.6 g/dL (ref 3.5–5.2)
Alkaline Phosphatase: 54 U/L (ref 39–117)
Bilirubin, Direct: 0.2 mg/dL (ref 0.0–0.3)
Total Bilirubin: 0.8 mg/dL (ref 0.2–1.2)
Total Protein: 7.1 g/dL (ref 6.0–8.3)

## 2022-08-11 NOTE — Telephone Encounter (Signed)
Pt called back and I discussed results with him no concerns

## 2022-08-11 NOTE — Telephone Encounter (Signed)
-----   Message from Neena Rhymes sent at 08/11/2022  1:23 PM EDT ----- Liver functions are trending in the right direction (down)- this is good news!  No changes at this time

## 2022-09-04 ENCOUNTER — Other Ambulatory Visit (HOSPITAL_BASED_OUTPATIENT_CLINIC_OR_DEPARTMENT_OTHER): Payer: Self-pay

## 2022-09-20 ENCOUNTER — Encounter: Payer: 59 | Admitting: Dermatology

## 2022-09-21 ENCOUNTER — Encounter: Payer: 59 | Admitting: Dermatology

## 2022-11-28 ENCOUNTER — Other Ambulatory Visit (HOSPITAL_BASED_OUTPATIENT_CLINIC_OR_DEPARTMENT_OTHER): Payer: Self-pay

## 2022-11-28 ENCOUNTER — Other Ambulatory Visit: Payer: Self-pay | Admitting: Family Medicine

## 2022-11-28 MED ORDER — FLUOXETINE HCL 40 MG PO CAPS
40.0000 mg | ORAL_CAPSULE | Freq: Every day | ORAL | 3 refills | Status: DC
  Filled 2022-11-28 – 2022-12-12 (×2): qty 30, 30d supply, fill #0
  Filled 2023-01-11: qty 30, 30d supply, fill #1
  Filled 2023-02-12: qty 30, 30d supply, fill #2
  Filled 2023-03-14: qty 30, 30d supply, fill #3

## 2022-12-08 ENCOUNTER — Other Ambulatory Visit (HOSPITAL_BASED_OUTPATIENT_CLINIC_OR_DEPARTMENT_OTHER): Payer: Self-pay

## 2022-12-12 ENCOUNTER — Other Ambulatory Visit (HOSPITAL_BASED_OUTPATIENT_CLINIC_OR_DEPARTMENT_OTHER): Payer: Self-pay

## 2022-12-22 IMAGING — DX DG KNEE COMPLETE 4+V*R*
4 series · 4 of 4 positions shown · non-contrast
Comparison: None.

CLINICAL DATA: Fall with right knee pain

EXAM:
RIGHT KNEE - COMPLETE 4+ VIEW

[knee ap]
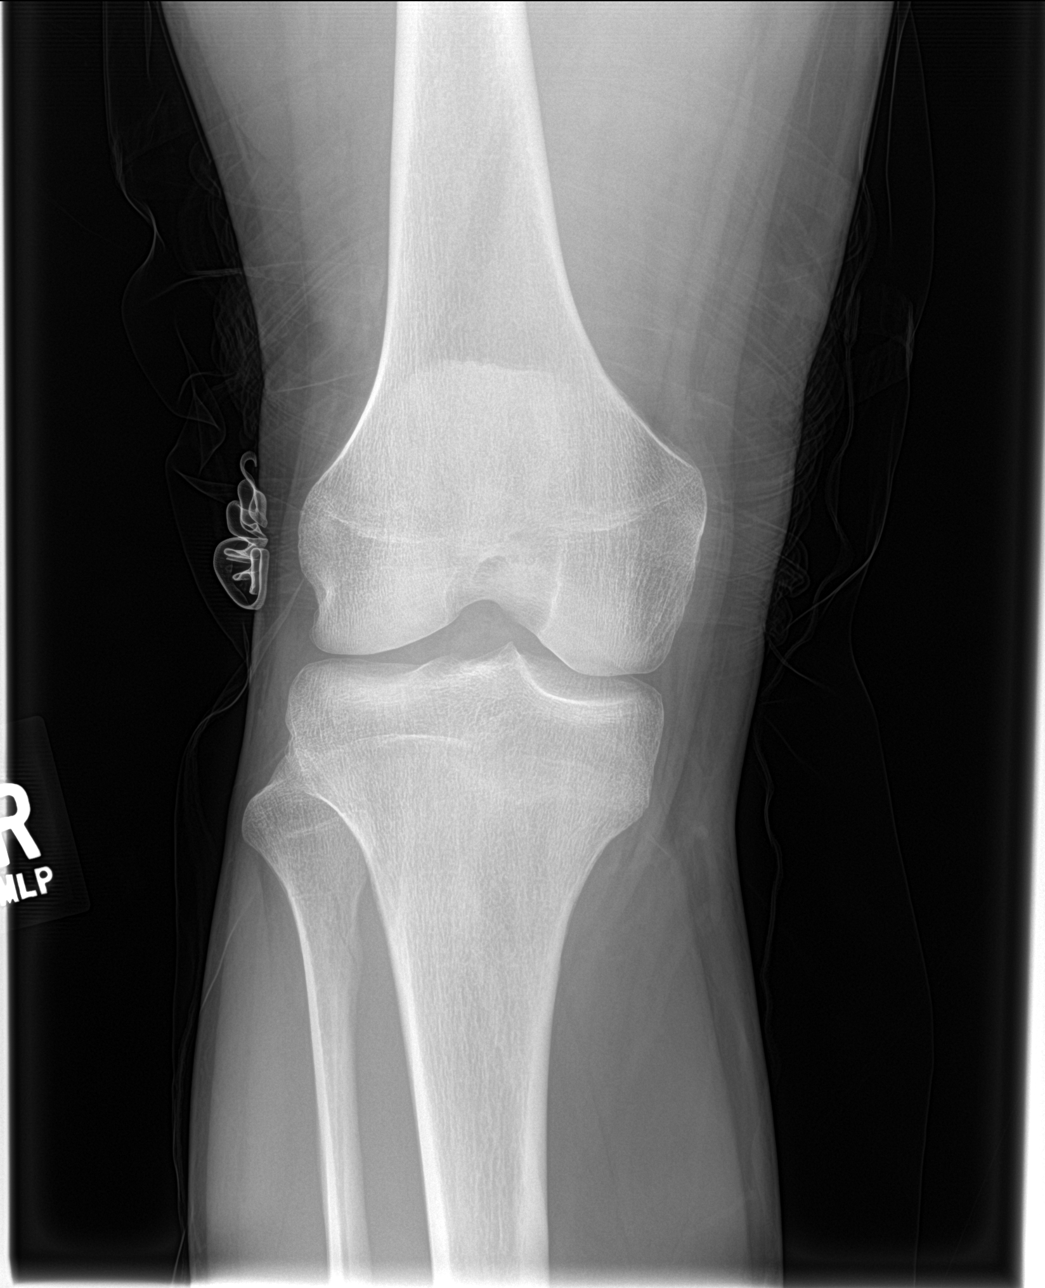

[knee obl (1 of 2)]
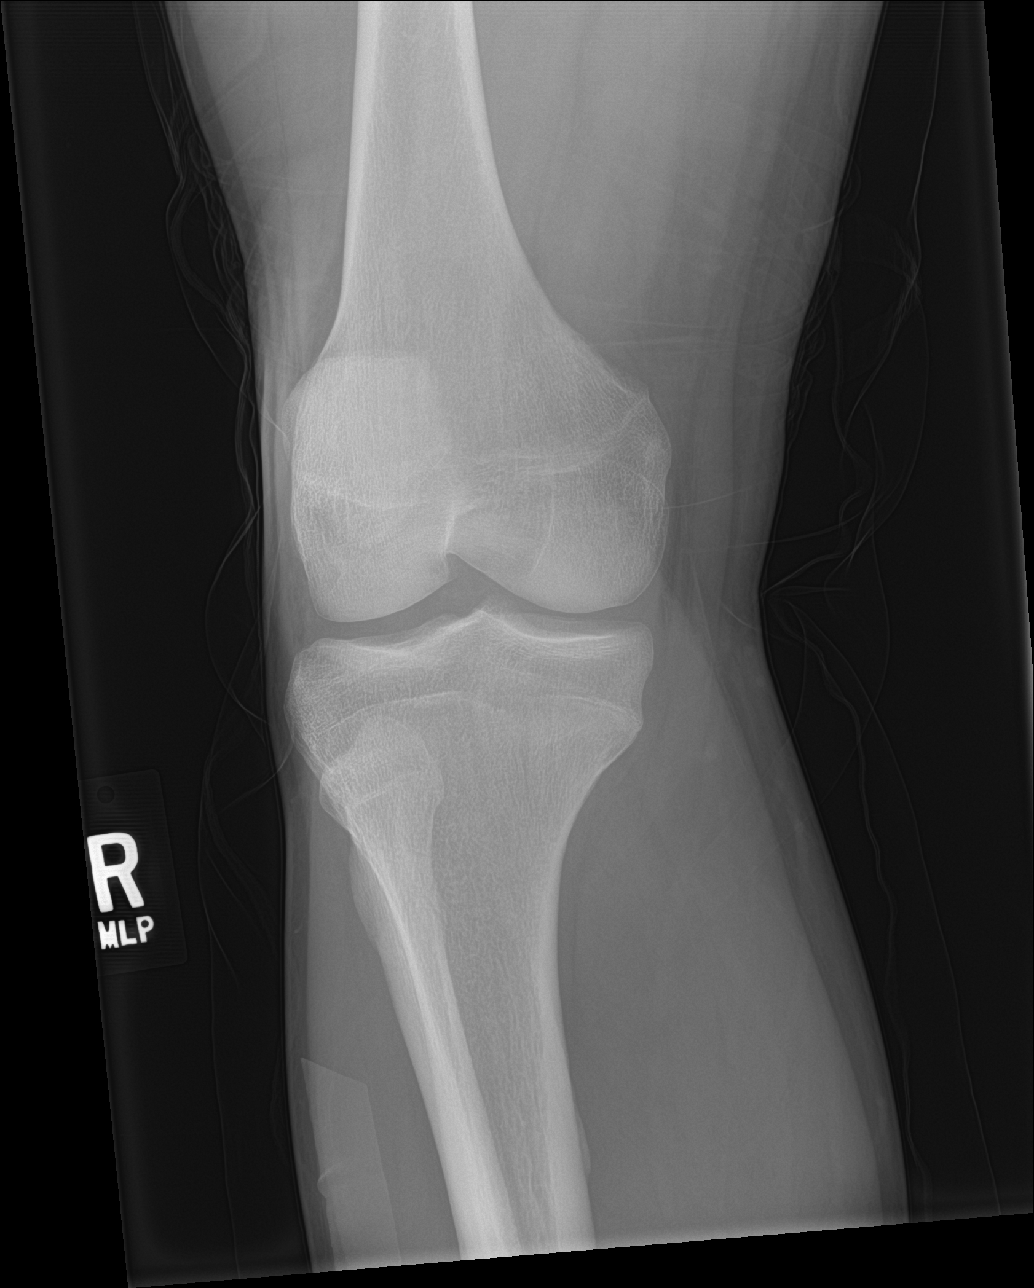

[patella lat]
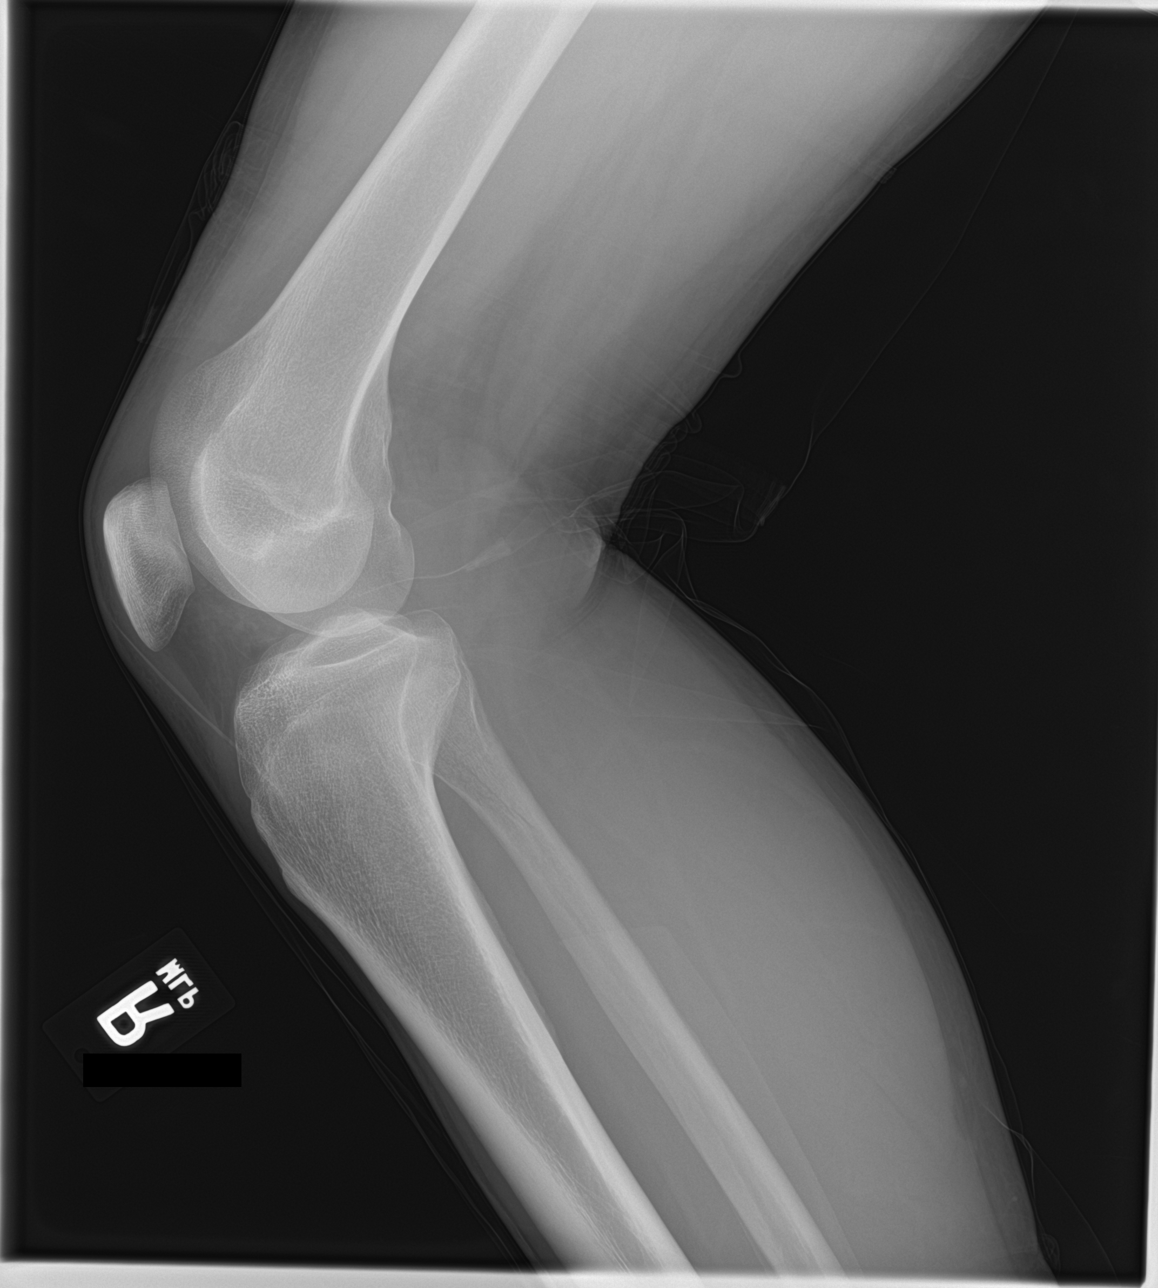

[knee obl (2 of 2)]
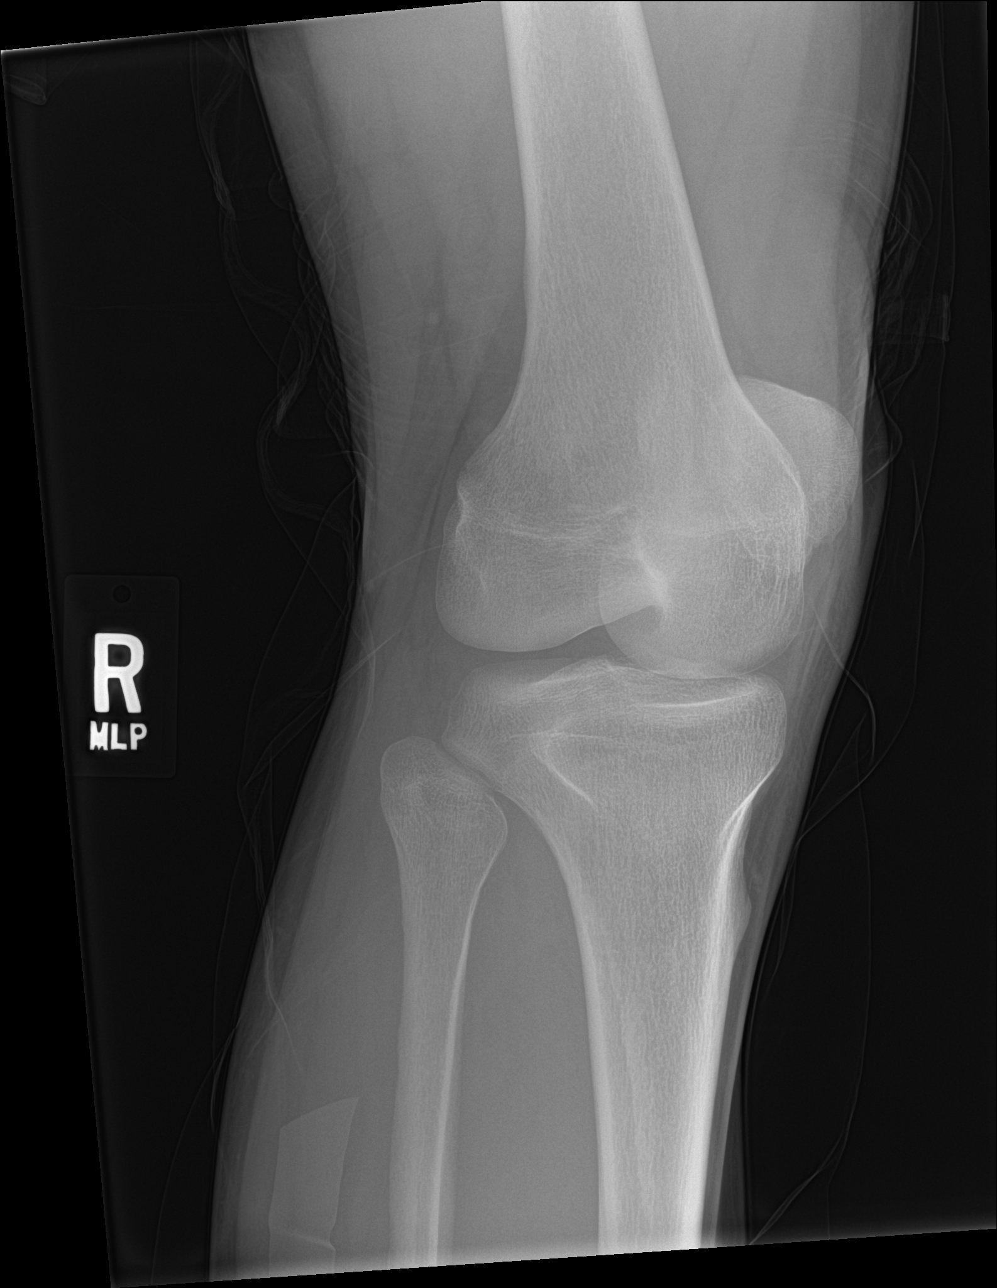

[4 of 4 positions shown; findings below may reference images not displayed]

FINDINGS: Longitudinal lucency at the fibular neck favoring nutrient channel.
Normal knee alignment. Negative for joint effusion.
IMPRESSION: Lucency at the fibular neck favoring nutrient channel over
nondisplaced fracture. Please correlate for focal pain and attention
on pending leg radiograph.

## 2023-01-22 ENCOUNTER — Other Ambulatory Visit (HOSPITAL_BASED_OUTPATIENT_CLINIC_OR_DEPARTMENT_OTHER): Payer: Self-pay

## 2023-01-22 ENCOUNTER — Other Ambulatory Visit: Payer: Self-pay | Admitting: Family Medicine

## 2023-01-22 MED ORDER — NEBIVOLOL HCL 5 MG PO TABS
5.0000 mg | ORAL_TABLET | Freq: Every day | ORAL | 1 refills | Status: DC
  Filled 2023-01-22: qty 90, 90d supply, fill #0
  Filled 2023-04-16: qty 90, 90d supply, fill #1

## 2023-02-08 ENCOUNTER — Other Ambulatory Visit (HOSPITAL_BASED_OUTPATIENT_CLINIC_OR_DEPARTMENT_OTHER): Payer: Self-pay

## 2023-02-08 ENCOUNTER — Encounter: Payer: Self-pay | Admitting: Dermatology

## 2023-02-08 ENCOUNTER — Ambulatory Visit: Payer: BC Managed Care – PPO | Admitting: Dermatology

## 2023-02-08 VITALS — BP 162/98 | HR 74

## 2023-02-08 DIAGNOSIS — D2271 Melanocytic nevi of right lower limb, including hip: Secondary | ICD-10-CM | POA: Diagnosis not present

## 2023-02-08 DIAGNOSIS — Z1283 Encounter for screening for malignant neoplasm of skin: Secondary | ICD-10-CM

## 2023-02-08 DIAGNOSIS — D485 Neoplasm of uncertain behavior of skin: Secondary | ICD-10-CM

## 2023-02-08 DIAGNOSIS — L578 Other skin changes due to chronic exposure to nonionizing radiation: Secondary | ICD-10-CM

## 2023-02-08 DIAGNOSIS — D1801 Hemangioma of skin and subcutaneous tissue: Secondary | ICD-10-CM

## 2023-02-08 DIAGNOSIS — L570 Actinic keratosis: Secondary | ICD-10-CM | POA: Diagnosis not present

## 2023-02-08 DIAGNOSIS — Z808 Family history of malignant neoplasm of other organs or systems: Secondary | ICD-10-CM

## 2023-02-08 DIAGNOSIS — L821 Other seborrheic keratosis: Secondary | ICD-10-CM

## 2023-02-08 DIAGNOSIS — Z7189 Other specified counseling: Secondary | ICD-10-CM

## 2023-02-08 DIAGNOSIS — D492 Neoplasm of unspecified behavior of bone, soft tissue, and skin: Secondary | ICD-10-CM

## 2023-02-08 DIAGNOSIS — L814 Other melanin hyperpigmentation: Secondary | ICD-10-CM

## 2023-02-08 DIAGNOSIS — D229 Melanocytic nevi, unspecified: Secondary | ICD-10-CM

## 2023-02-08 DIAGNOSIS — D2261 Melanocytic nevi of right upper limb, including shoulder: Secondary | ICD-10-CM | POA: Diagnosis not present

## 2023-02-08 DIAGNOSIS — W908XXA Exposure to other nonionizing radiation, initial encounter: Secondary | ICD-10-CM

## 2023-02-08 MED ORDER — FLUOROURACIL 5 % EX CREA
TOPICAL_CREAM | Freq: Two times a day (BID) | CUTANEOUS | 0 refills | Status: AC
  Filled 2023-02-08: qty 40, 14d supply, fill #0

## 2023-02-08 NOTE — Patient Instructions (Addendum)
Important Information  Due to recent changes in healthcare laws, you may see results of your pathology and/or laboratory studies on MyChart before the doctors have had a chance to review them. We understand that in some cases there may be results that are confusing or concerning to you. Please understand that not all results are received at the same time and often the doctors may need to interpret multiple results in order to provide you with the best plan of care or course of treatment. Therefore, we ask that you please give [place] 2 business days to thoroughly review all your results before contacting the office for clarification. Should we see a critical lab result, you will be contacted sooner.   If You Need Anything After Your Visit  If you have any questions or concerns for your doctor, please call our main line at [phone] If no one answers, please leave a voicemail as directed and we will return your call as soon as possible. Messages left after 4 pm will be answered the following business day.   You may also send [place] a message via MyChart. We typically respond to MyChart messages within 1-2 business days.  For prescription refills, please ask your pharmacy to contact our office. Our fax number is [phone].  If you have an urgent issue when the clinic is closed that cannot wait until the next business day, you can page your doctor at the number below.    Please note that while we do our best to be available for urgent issues outside of office hours, we are not available 24/7.   If you have an urgent issue and are unable to reach [place], you may choose to seek medical care at your doctor's office, retail clinic, urgent care center, or emergency room.  If you have a medical emergency, please immediately call 911 or go to the emergency department. In the event of inclement weather, please call our main line at [phone] for an update on the status of any delays or  closures.  Dermatology Medication Tips: Please keep the boxes that topical medications come in in order to help keep track of the instructions about where and how to use these. Pharmacies typically print the medication instructions only on the boxes and not directly on the medication tubes.   If your medication is too expensive, please contact our office at [phone] or send [place] a message through MyChart.   We are unable to tell what your co-pay for medications will be in advance as this is different depending on your insurance coverage. However, we may be able to find a substitute medication at lower cost or fill out paperwork to get insurance to cover a needed medication.   If a prior authorization is required to get your medication covered by your insurance company, please allow [place] 1-2 business days to complete this process.  Drug prices often vary depending on where the prescription is filled and some pharmacies may offer cheaper prices.  The website www.goodrx.com contains coupons for medications through different pharmacies. The prices here do not account for what the cost may be with help from insurance (it may be cheaper with your insurance), but the website can give you the price if you did not use any insurance.  - You can print the associated coupon and take it with your prescription to the pharmacy.  - You may also stop by our office during regular business hours and pick up a GoodRx coupon card.  - If  you need your prescription sent electronically to a different pharmacy, notify our office through [facility] or by phone at [phone]  Patient Handout: Wound Care for Skin Biopsy Site  Taking Care of Your Skin Biopsy Site  Proper care of the biopsy site is essential for promoting healing and minimizing scarring. This handout provides instructions on how to care for your biopsy site to ensure optimal recovery.  1. Cleaning the Wound:  Clean the biopsy site daily with  gentle soap and water. Gently pat the area dry with a clean, soft towel. Avoid harsh scrubbing or rubbing the area, as this can irritate the skin and delay healing.  2. Applying Aquaphor and Bandage:  After cleaning the wound, apply a thin layer of Aquaphor ointment to the biopsy site. Cover the area with a sterile bandage to protect it from dirt, bacteria, and friction. Change the bandage daily or as needed if it becomes soiled or wet.  3. Continued Care for One Week:  Repeat the cleaning, Aquaphor application, and bandaging process daily for one week following the biopsy procedure. Keeping the wound clean and moist during this initial healing period will help prevent infection and promote optimal healing.  4. Massaging Aquaphor into the Area:  ---After one week, discontinue the use of bandages but continue to apply Aquaphor to the biopsy site. ----Gently massage the Aquaphor into the area using circular motions. ---Massaging the skin helps to promote circulation and prevent the formation of scar tissue.   Additional Tips:  Avoid exposing the biopsy site to direct sunlight during the healing process, as this can cause hyperpigmentation or worsen scarring. If you experience any signs of infection, such as increased redness, swelling, warmth, or drainage from the wound, contact your healthcare provider immediately. Follow any additional instructions provided by your healthcare provider for caring for the biopsy site and managing any discomfort. Conclusion:  Taking proper care of your skin biopsy site is crucial for ensuring optimal healing and minimizing scarring. By following these instructions for cleaning, applying Aquaphor, and massaging the area, you can promote a smooth and successful recovery. If you have any questions or concerns about caring for your biopsy site, don't hesitate to contact your healthcare provider for guidance.  Fluorouracil Cream or Topical Solution What is this  medication? FLUOROURACIL (flure oh YOOR a sil) treats rough, scaly spots on the skin caused by sun exposure. It may also be used to treat skin cancer. It works by slowing down the growth of irregular skin cells. This medicine may be used for other purposes; ask your health care provider or pharmacist if you have questions. COMMON BRAND NAME(S): Carac, Efudex, Fluoroplex, Tolak What should I tell my care team before I take this medication? They need to know if you have any of these conditions: Dihydropyrimidine dehydrogenase (DPD) deficiency An unusual or allergic reaction to fluorouracil, other medications, foods, dyes, or preservatives Pregnant or trying to get pregnant Breastfeeding How should I use this medication? This medication is for external use only. Do not take it by mouth. Wash your hands before and after use. Do not get it in your eyes. If you do, rinse your eyes with plenty of cool tap water. Use it as directed on the prescription label at the same time every day. Do not use it more often than directed. Use it for the full course as directed by your care team, even if you think you are better. Do not stop using it unless your care team tells  you to stop it early. Apply a thin film of the medication to the affected area. You can cover the area with a sterile gauze dressing (bandage). Do not use an airtight bandage (such as a plastic-covered bandage). Talk to your care team about the use of this medication in children. Special care may be needed. Overdosage: If you think you have taken too much of this medicine contact a poison control center or emergency room at once. NOTE: This medicine is only for you. Do not share this medicine with others. What if I miss a dose? If you miss a dose, use it as soon as you can. If it is almost time for your next dose, use only that dose. Do not use double or extra doses. What may interact with this medication? Interactions are not expected. Do not use  any other skin products on the same area of skin without talking to your care team. This list may not describe all possible interactions. Give your health care provider a list of all the medicines, herbs, non-prescription drugs, or dietary supplements you use. Also tell them if you smoke, drink alcohol, or use illegal drugs. Some items may interact with your medicine. What should I watch for while using this medication? Visit your care team for regular checks on your progress. It may be some time before you see the benefit from this medication. Make sure you know what you are using this medication for. It comes in different strengths. Make sure you have the correct strength. All strengths can be used for sun spots. Only the 5% products are used for skin cancer. Treated areas of skin can look unsightly during and for several weeks after treatment with this medication. Do not get this medication in your eyes. If you do, rinse out with plenty of cool tap water. This medication can make you more sensitive to the sun. Keep out of the sun. If you cannot avoid being in the sun, wear protective clothing and sunscreen. Do not use sun lamps, tanning beds, or tanning booths. Serious side effects or death can occur if a pet comes into contact with this medication. Contact a vet right away if a pet touches or licks the medication on your skin or comes into contact with the container. Get rid of or wash any items used to apply this medication. Wash your hands after applying the medication. Make sure the medication does not get on clothing, carpet, or furniture. If you cannot avoid skin to skin contact with your pet, ask your care team if you can cover the area(s) where you apply this medication. Talk to your care team if you may be pregnant. Serious birth defects can occur if you take this medication during pregnancy. Contraception is recommended while taking this medication. Your care team can help you find the option  that works for you. Talk to your care team before breastfeeding. Changes to your treatment plan may be needed. What side effects may I notice from receiving this medication? Side effects that you should report to your care team as soon as possible: Allergic reactions--skin rash, itching, hives, swelling of the face, lips, tongue, or throat Painful swelling, warmth, or redness of the skin, blisters or sores Side effects that usually do not require medical attention (report to your care team if they continue or are bothersome): Mild skin irritation, redness, or dryness Skin reactions on sun-exposed areas This list may not describe all possible side effects. Call your doctor for medical advice  about side effects. You may report side effects to FDA at 1-800-FDA-1088. Where should I keep my medication? Keep out of the reach of children and pets. See product for storage information. Each product may have different instructions. Throw away any unused medication after the expiration date. NOTE: This sheet is a summary. It may not cover all possible information. If you have questions about this medicine, talk to your doctor, pharmacist, or health care provider.  2024 Elsevier/Gold Standard (2022-02-14 00:00:00)

## 2023-02-08 NOTE — Progress Notes (Signed)
New Patient Visit   Subjective  Miguel Love is a 33 y.o. male who presents for the following: Skin Cancer Screening and Full Body Skin Exam. No hx of skin cancer. Family hx of BCC.  The patient presents for Total-Body Skin Exam (TBSE) for skin cancer screening and mole check. The patient has spots, moles and lesions to be evaluated, some may be new or changing.  The following portions of the chart were reviewed this encounter and updated as appropriate: medications, allergies, medical history  Review of Systems:  No other skin or systemic complaints except as noted in HPI or Assessment and Plan.  Objective  Well appearing patient in no apparent distress; mood and affect are within normal limits.  A full examination was performed including scalp, head, eyes, ears, nose, lips, neck, chest, axillae, abdomen, back, buttocks, bilateral upper extremities, bilateral lower extremities, hands, feet, fingers, toes, fingernails, and toenails. All findings within normal limits unless otherwise noted below.   Relevant physical exam findings are noted in the Assessment and Plan.  Right 2nd Metatarsophalangeal Joint 3 mm brown irregularly shaped macule   Assessment & Plan   SKIN CANCER SCREENING PERFORMED TODAY.  ACTINIC DAMAGE - Chronic condition, secondary to cumulative UV/sun exposure - diffuse scaly erythematous macules with underlying dyspigmentation - Recommend daily broad spectrum sunscreen SPF 30+ to sun-exposed areas, reapply every 2 hours as needed.  - Staying in the shade or wearing long sleeves, sun glasses (UVA+UVB protection) and wide brim hats (4-inch brim around the entire circumference of the hat) are also recommended for sun protection.  - Call for new or changing lesions.  LENTIGINES, SEBORRHEIC KERATOSES, HEMANGIOMAS - Benign normal skin lesions - Benign-appearing - Call for any changes  MELANOCYTIC NEVI - Tan-brown and/or pink-flesh-colored symmetric macules and  papules - Benign appearing on exam today - Observation - Call clinic for new or changing moles - Recommend daily use of broad spectrum spf 30+ sunscreen to sun-exposed areas.   ACTINIC KERATOSIS Exam: Erythematous thin papules/macules with gritty scale at the nose  Actinic keratoses are precancerous spots that appear secondary to cumulative UV radiation exposure/sun exposure over time. They are chronic with expected duration over 1 year. A portion of actinic keratoses will progress to squamous cell carcinoma of the skin. It is not possible to reliably predict which spots will progress to skin cancer and so treatment is recommended to prevent development of skin cancer.  Recommend daily broad spectrum sunscreen SPF 30+ to sun-exposed areas, reapply every 2 hours as needed.  Recommend staying in the shade or wearing long sleeves, sun glasses (UVA+UVB protection) and wide brim hats (4-inch brim around the entire circumference of the hat). Call for new or changing lesions.  Treatment Plan: Start 5-fluorouracil cream twice a day for 14 days to affected areas including nose.  Reviewed course of treatment and expected reaction.  Patient advised to expect inflammation and crusting and advised that erosions are possible.  Patient advised to be diligent with sun protection during and after treatment. Handout with details of how to apply medication and what to expect provided. Counseled to keep medication out of reach of children and pets.  Reviewed course of treatment and expected reaction.  Patient advised to expect inflammation and crusting and advised that erosions are possible.  Patient advised to be diligent with sun protection during and after treatment. Handout with details of how to apply medication and what to expect provided. Counseled to keep medication out of reach of children  and pets.  NEOPLASM OF UNCERTAIN BEHAVIOR OF SKIN Right 2nd Metatarsophalangeal Joint Skin / nail biopsy Type of  biopsy: tangential   Informed consent: discussed and consent obtained   Timeout: patient name, date of birth, surgical site, and procedure verified   Anesthesia: the lesion was anesthetized in a standard fashion   Anesthetic:  1% lidocaine w/ epinephrine 1-100,000 buffered w/ 8.4% NaHCO3 Instrument used: DermaBlade   Hemostasis achieved with: aluminum chloride   Outcome: patient tolerated procedure well   Post-procedure details: sterile dressing applied and wound care instructions given   Dressing type: bandage and petrolatum   Additional details:  3mm irregular shaped brown macule  AK (ACTINIC KERATOSIS) Dorsum of Nose fluorouracil (EFUDEX) 5 % cream - Dorsum of Nose Apply topically 2 (two) times daily for 14 days.  Return in about 8 weeks (around 04/05/2023) for AK f/u.  [name]  Documentation: I have reviewed the above documentation for accuracy and completeness, and I agree with the above.  Gwenith Daily, MD

## 2023-02-12 ENCOUNTER — Other Ambulatory Visit (HOSPITAL_BASED_OUTPATIENT_CLINIC_OR_DEPARTMENT_OTHER): Payer: Self-pay

## 2023-02-13 LAB — SURGICAL PATHOLOGY

## 2023-02-23 DIAGNOSIS — H5213 Myopia, bilateral: Secondary | ICD-10-CM | POA: Diagnosis not present

## 2023-04-02 ENCOUNTER — Ambulatory Visit: Payer: BC Managed Care – PPO | Admitting: Dermatology

## 2023-04-02 ENCOUNTER — Encounter: Payer: Self-pay | Admitting: Dermatology

## 2023-04-02 VITALS — BP 134/77 | HR 66

## 2023-04-02 DIAGNOSIS — W908XXD Exposure to other nonionizing radiation, subsequent encounter: Secondary | ICD-10-CM | POA: Diagnosis not present

## 2023-04-02 DIAGNOSIS — D2239 Melanocytic nevi of other parts of face: Secondary | ICD-10-CM

## 2023-04-02 DIAGNOSIS — L570 Actinic keratosis: Secondary | ICD-10-CM

## 2023-04-02 NOTE — Progress Notes (Signed)
   Follow-Up Visit   Subjective  Miguel Love is a 33 y.o. male who presents for the following: AKs Pt here to follow up s/p efudex treatment to nose. Pt states his nose got very red and irritated like a bad sunburn. Pt finished 2 week treatment around Feb 16th. He thinks there has been improvement. He has one persistent lesion of concern on the nose, present for awhile, did not respond to treatment.   The following portions of the chart were reviewed this encounter and updated as appropriate: medications, allergies, medical history  Review of Systems:  No other skin or systemic complaints except as noted in HPI or Assessment and Plan.  Objective  Well appearing patient in no apparent distress; mood and affect are within normal limits.  A focused examination was performed of the following areas: nose  Relevant exam findings are noted in the Assessment and Plan.    Assessment & Plan   Angiofibroma/Fibrous Papule bridge of nose - 1-2 mm smooth symmetric flesh colored to pink papule(s) without features suspicious for malignancy on dermoscopy - Benign-appearing.  Observation.  Call clinic for new or changing lesions.   -Discussed that it has some telangiectasias within it and may respond to PDL laser  ACTINIC KERATOSIS s/p efudex treatment- improved s/p 5FU Exam: previously  Erythematous thin papules/macules with gritty scale at the on nose  Actinic keratoses are precancerous spots that appear secondary to cumulative UV radiation exposure/sun exposure over time. They are chronic with expected duration over 1 year. A portion of actinic keratoses will progress to squamous cell carcinoma of the skin. It is not possible to reliably predict which spots will progress to skin cancer and so treatment is recommended to prevent development of skin cancer.  Recommend daily broad spectrum sunscreen SPF 30+ to sun-exposed areas, reapply every 2 hours as needed.  Recommend staying in the shade  or wearing long sleeves, sun glasses (UVA+UVB protection) and wide brim hats (4-inch brim around the entire circumference of the hat). Call for new or changing lesions.  Return in about 1 year (around 04/01/2024) for TBSE.  I, Tillie Fantasia, CMA, am acting as scribe for Gwenith Daily, MD.   Documentation: I have reviewed the above documentation for accuracy and completeness, and I agree with the above.  Gwenith Daily, MD

## 2023-04-02 NOTE — Patient Instructions (Addendum)
 [facility] with [facility] for spot on nose [phone]  Important Information   Due to recent changes in healthcare laws, you may see results of your pathology and/or laboratory studies on MyChart before the doctors have had a chance to review them. We understand that in some cases there may be results that are confusing or concerning to you. Please understand that not all results are received at the same time and often the doctors may need to interpret multiple results in order to provide you with the best plan of care or course of treatment. Therefore, we ask that you please give [place] 2 business days to thoroughly review all your results before contacting the office for clarification. Should we see a critical lab result, you will be contacted sooner.     If You Need Anything After Your Visit   If you have any questions or concerns for your doctor, please call our main line at [phone]. If no one answers, please leave a voicemail as directed and we will return your call as soon as possible. Messages left after 4 pm will be answered the following business day.    You may also send [place] a message via MyChart. We typically respond to MyChart messages within 1-2 business days.  For prescription refills, please ask your pharmacy to contact our office. Our fax number is [phone].  If you have an urgent issue when the clinic is closed that cannot wait until the next business day, you can page your doctor at the number below.     Please note that while we do our best to be available for urgent issues outside of office hours, we are not available 24/7.    If you have an urgent issue and are unable to reach [place], you may choose to seek medical care at your doctor's office, retail clinic, urgent care center, or emergency room.   If you have a medical emergency, please immediately call 911 or go to the emergency department. In the event of inclement weather, please call our main line at  [phone] for an update on the status of any delays or closures.  Dermatology Medication Tips: Please keep the boxes that topical medications come in in order to help keep track of the instructions about where and how to use these. Pharmacies typically print the medication instructions only on the boxes and not directly on the medication tubes.   If your medication is too expensive, please contact our office at [phone] or send [place] a message through MyChart.    We are unable to tell what your co-pay for medications will be in advance as this is different depending on your insurance coverage. However, we may be able to find a substitute medication at lower cost or fill out paperwork to get insurance to cover a needed medication.    If a prior authorization is required to get your medication covered by your insurance company, please allow [place] 1-2 business days to complete this process.   Drug prices often vary depending on where the prescription is filled and some pharmacies may offer cheaper prices.   The website www.goodrx.com contains coupons for medications through different pharmacies. The prices here do not account for what the cost may be with help from insurance (it may be cheaper with your insurance), but the website can give you the price if you did not use any insurance.  - You can print the associated coupon and take it with your prescription to  the pharmacy.  - You may also stop by our office during regular business hours and pick up a GoodRx coupon card.  - If you need your prescription sent electronically to a different pharmacy, notify our office through [facility] or by phone at [phone]

## 2023-04-09 ENCOUNTER — Other Ambulatory Visit (HOSPITAL_BASED_OUTPATIENT_CLINIC_OR_DEPARTMENT_OTHER): Payer: Self-pay

## 2023-04-09 ENCOUNTER — Other Ambulatory Visit: Payer: Self-pay | Admitting: Family Medicine

## 2023-04-09 MED ORDER — FLUOXETINE HCL 40 MG PO CAPS
40.0000 mg | ORAL_CAPSULE | Freq: Every day | ORAL | 3 refills | Status: DC
  Filled 2023-04-16 – 2023-05-03 (×2): qty 30, 30d supply, fill #0
  Filled 2023-06-05 (×2): qty 30, 30d supply, fill #1
  Filled 2023-06-26: qty 30, 30d supply, fill #2

## 2023-04-16 ENCOUNTER — Other Ambulatory Visit (HOSPITAL_BASED_OUTPATIENT_CLINIC_OR_DEPARTMENT_OTHER): Payer: Self-pay

## 2023-05-03 ENCOUNTER — Other Ambulatory Visit: Payer: Self-pay | Admitting: Family Medicine

## 2023-05-03 ENCOUNTER — Other Ambulatory Visit (HOSPITAL_COMMUNITY): Payer: Self-pay

## 2023-05-03 MED ORDER — NEBIVOLOL HCL 5 MG PO TABS
5.0000 mg | ORAL_TABLET | Freq: Every day | ORAL | 1 refills | Status: DC
  Filled 2023-05-03: qty 30, 30d supply, fill #0
  Filled 2023-05-03: qty 90, 90d supply, fill #0
  Filled 2023-06-05 (×2): qty 30, 30d supply, fill #1
  Filled 2023-06-26 – 2023-06-27 (×2): qty 30, 30d supply, fill #2
  Filled 2023-07-30 (×3): qty 30, 30d supply, fill #3
  Filled 2023-08-27: qty 30, 30d supply, fill #4
  Filled 2023-09-24 – 2023-09-26 (×2): qty 30, 30d supply, fill #5

## 2023-05-08 ENCOUNTER — Other Ambulatory Visit (HOSPITAL_COMMUNITY): Payer: Self-pay

## 2023-06-05 ENCOUNTER — Other Ambulatory Visit (HOSPITAL_COMMUNITY): Payer: Self-pay

## 2023-06-26 ENCOUNTER — Other Ambulatory Visit (HOSPITAL_COMMUNITY): Payer: Self-pay

## 2023-06-26 ENCOUNTER — Other Ambulatory Visit: Payer: Self-pay

## 2023-06-27 ENCOUNTER — Other Ambulatory Visit (HOSPITAL_COMMUNITY): Payer: Self-pay

## 2023-06-28 ENCOUNTER — Other Ambulatory Visit (HOSPITAL_COMMUNITY): Payer: Self-pay

## 2023-06-29 ENCOUNTER — Other Ambulatory Visit (HOSPITAL_COMMUNITY): Payer: Self-pay

## 2023-07-28 ENCOUNTER — Other Ambulatory Visit: Payer: Self-pay | Admitting: Family Medicine

## 2023-07-30 ENCOUNTER — Other Ambulatory Visit (HOSPITAL_BASED_OUTPATIENT_CLINIC_OR_DEPARTMENT_OTHER): Payer: Self-pay

## 2023-07-30 ENCOUNTER — Other Ambulatory Visit (HOSPITAL_COMMUNITY): Payer: Self-pay

## 2023-07-30 ENCOUNTER — Other Ambulatory Visit: Payer: Self-pay

## 2023-07-30 MED ORDER — FLUOXETINE HCL 40 MG PO CAPS
40.0000 mg | ORAL_CAPSULE | Freq: Every day | ORAL | 3 refills | Status: DC
  Filled 2023-07-30: qty 30, 30d supply, fill #0
  Filled 2023-09-06: qty 30, 30d supply, fill #1
  Filled 2023-10-10: qty 30, 30d supply, fill #2
  Filled 2023-11-12: qty 30, 30d supply, fill #3

## 2023-07-31 ENCOUNTER — Other Ambulatory Visit (HOSPITAL_COMMUNITY): Payer: Self-pay

## 2023-08-31 ENCOUNTER — Other Ambulatory Visit (HOSPITAL_COMMUNITY): Payer: Self-pay

## 2023-08-31 ENCOUNTER — Ambulatory Visit: Admitting: Family Medicine

## 2023-08-31 ENCOUNTER — Encounter: Payer: Self-pay | Admitting: Family Medicine

## 2023-08-31 VITALS — BP 102/68 | HR 67 | Temp 97.9°F | Resp 20 | Ht 65.0 in | Wt 160.0 lb

## 2023-08-31 DIAGNOSIS — Z0000 Encounter for general adult medical examination without abnormal findings: Secondary | ICD-10-CM | POA: Diagnosis not present

## 2023-08-31 DIAGNOSIS — J302 Other seasonal allergic rhinitis: Secondary | ICD-10-CM

## 2023-08-31 DIAGNOSIS — I10 Essential (primary) hypertension: Secondary | ICD-10-CM

## 2023-08-31 DIAGNOSIS — J45909 Unspecified asthma, uncomplicated: Secondary | ICD-10-CM

## 2023-08-31 DIAGNOSIS — E663 Overweight: Secondary | ICD-10-CM | POA: Insufficient documentation

## 2023-08-31 LAB — HEPATIC FUNCTION PANEL
ALT: 96 U/L — ABNORMAL HIGH (ref 0–53)
AST: 48 U/L — ABNORMAL HIGH (ref 0–37)
Albumin: 4.6 g/dL (ref 3.5–5.2)
Alkaline Phosphatase: 65 U/L (ref 39–117)
Bilirubin, Direct: 0.1 mg/dL (ref 0.0–0.3)
Total Bilirubin: 0.7 mg/dL (ref 0.2–1.2)
Total Protein: 7.1 g/dL (ref 6.0–8.3)

## 2023-08-31 LAB — CBC WITH DIFFERENTIAL/PLATELET
Basophils Absolute: 0 K/uL (ref 0.0–0.1)
Basophils Relative: 0.7 % (ref 0.0–3.0)
Eosinophils Absolute: 0.1 K/uL (ref 0.0–0.7)
Eosinophils Relative: 1.5 % (ref 0.0–5.0)
HCT: 47.9 % (ref 39.0–52.0)
Hemoglobin: 16.4 g/dL (ref 13.0–17.0)
Lymphocytes Relative: 32.5 % (ref 12.0–46.0)
Lymphs Abs: 1.2 K/uL (ref 0.7–4.0)
MCHC: 34.2 g/dL (ref 30.0–36.0)
MCV: 86.6 fl (ref 78.0–100.0)
Monocytes Absolute: 0.3 K/uL (ref 0.1–1.0)
Monocytes Relative: 6.8 % (ref 3.0–12.0)
Neutro Abs: 2.2 K/uL (ref 1.4–7.7)
Neutrophils Relative %: 58.5 % (ref 43.0–77.0)
Platelets: 199 K/uL (ref 150.0–400.0)
RBC: 5.53 Mil/uL (ref 4.22–5.81)
RDW: 12.6 % (ref 11.5–15.5)
WBC: 3.8 K/uL — ABNORMAL LOW (ref 4.0–10.5)

## 2023-08-31 LAB — BASIC METABOLIC PANEL WITH GFR
BUN: 11 mg/dL (ref 6–23)
CO2: 29 meq/L (ref 19–32)
Calcium: 9.5 mg/dL (ref 8.4–10.5)
Chloride: 102 meq/L (ref 96–112)
Creatinine, Ser: 0.91 mg/dL (ref 0.40–1.50)
GFR: 109.41 mL/min (ref >60.00)
Glucose, Bld: 85 mg/dL (ref 70–99)
Potassium: 4.1 meq/L (ref 3.5–5.1)
Sodium: 140 meq/L (ref 135–145)

## 2023-08-31 LAB — LIPID PANEL
Cholesterol: 193 mg/dL (ref 0–200)
HDL: 49.2 mg/dL (ref >39.00)
LDL Cholesterol: 120 mg/dL — ABNORMAL HIGH (ref 0–99)
NonHDL: 143.76
Total CHOL/HDL Ratio: 4
Triglycerides: 117 mg/dL (ref 0.0–149.0)
VLDL: 23.4 mg/dL (ref 0.0–40.0)

## 2023-08-31 LAB — TSH: TSH: 0.96 u[IU]/mL (ref 0.35–5.50)

## 2023-08-31 MED ORDER — FLUTICASONE PROPIONATE 50 MCG/ACT NA SUSP
2.0000 | Freq: Every day | NASAL | 3 refills | Status: AC
  Filled 2023-08-31: qty 16, 30d supply, fill #0

## 2023-08-31 MED ORDER — ALBUTEROL SULFATE HFA 108 (90 BASE) MCG/ACT IN AERS
2.0000 | INHALATION_SPRAY | Freq: Four times a day (QID) | RESPIRATORY_TRACT | 0 refills | Status: AC | PRN
Start: 2023-08-31 — End: ?
  Filled 2023-08-31: qty 6.7, 25d supply, fill #0

## 2023-08-31 NOTE — Patient Instructions (Signed)
Follow up in 1 year or as needed We'll notify you of your lab results and make any changes if needed Continue to work on healthy diet and regular exercise- you can do it! Call with any questions or concerns Stay Safe!  Stay Healthy!! Enjoy the rest of your summer!!!

## 2023-08-31 NOTE — Progress Notes (Signed)
   Subjective:    Patient ID: Miguel Love, male    DOB: 02/25/1990, 33 y.o.   MRN: [id number]  HPI CPE- UTD on Tdap.  Health Maintenance  Topic Date Due   Pneumococcal Vaccine (1 of 2 - PCV) Never done   HPV VACCINES (1 - Risk 3-dose SCDM series) Never done   INFLUENZA VACCINE  04/08/2024 (Originally 08/10/2023)   DTaP/Tdap/Td (7 - Td or Tdap) 06/24/2031   Hepatitis C Screening  Completed   HIV Screening  Completed   Meningococcal B Vaccine  Aged Out   COVID-19 Vaccine  Discontinued     Patient Care Team    Relationship Specialty Notifications Start End  Mahlon Comer BRAVO, MD PCP - General Family Medicine  05/15/11    Comment: Merged     Review of Systems Patient reports no vision/hearing changes, anorexia, fever ,adenopathy, persistant/recurrent hoarseness, swallowing issues, chest pain, palpitations, edema, persistant/recurrent cough, hemoptysis, dyspnea (rest,exertional, paroxysmal nocturnal), gastrointestinal  bleeding (melena, rectal bleeding), abdominal pain, excessive heart burn, GU symptoms (dysuria, hematuria, voiding/incontinence issues) syncope, focal weakness, memory loss, numbness & tingling, skin/hair/nail changes, depression, anxiety, abnormal bruising/bleeding, musculoskeletal symptoms/signs.     Objective:   Physical Exam General Appearance:    Alert, cooperative, no distress, appears stated age  Head:    Normocephalic, without obvious abnormality, atraumatic  Eyes:    PERRL, conjunctiva/corneas clear, EOM's intact both eyes       Ears:    Normal TM's and external ear canals, both ears  Nose:   Nares normal, septum midline, mucosa normal, no drainage   or sinus tenderness  Throat:   Lips, mucosa, and tongue normal; teeth and gums normal  Neck:   Supple, symmetrical, trachea midline, no adenopathy;       thyroid :  No enlargement/tenderness/nodules  Back:     Symmetric, no curvature, ROM normal, no CVA tenderness  Lungs:     Clear to auscultation bilaterally,  respirations unlabored  Chest wall:    No tenderness or deformity  Heart:    Regular rate and rhythm, S1 and S2 normal, no murmur, rub   or gallop  Abdomen:     Soft, non-tender, bowel sounds active all four quadrants,    no masses, no organomegaly  Genitalia:    deferred  Rectal:    Extremities:   Extremities normal, atraumatic, no cyanosis or edema  Pulses:   2+ and symmetric all extremities  Skin:   Skin color, texture, turgor normal, no rashes or lesions  Lymph nodes:   Cervical, supraclavicular, and axillary nodes normal  Neurologic:   CNII-XII intact. Normal strength, sensation and reflexes      throughout          Assessment & Plan:

## 2023-09-02 ENCOUNTER — Encounter: Payer: Self-pay | Admitting: Family Medicine

## 2023-09-02 DIAGNOSIS — R7989 Other specified abnormal findings of blood chemistry: Secondary | ICD-10-CM

## 2023-09-02 NOTE — Assessment & Plan Note (Signed)
Pt's PE WNL.  UTD on Tdap.  Check labs.  Anticipatory guidance provided.  

## 2023-09-03 ENCOUNTER — Ambulatory Visit: Payer: Self-pay | Admitting: Family Medicine

## 2023-09-03 NOTE — Telephone Encounter (Signed)
 Patient is questioning if he should have his lipid panel rechecked in 3 months? I did inform patient of your results note about the LFT's.

## 2023-09-24 ENCOUNTER — Other Ambulatory Visit (HOSPITAL_COMMUNITY): Payer: Self-pay

## 2023-09-24 ENCOUNTER — Other Ambulatory Visit (INDEPENDENT_AMBULATORY_CARE_PROVIDER_SITE_OTHER)

## 2023-09-24 DIAGNOSIS — R7989 Other specified abnormal findings of blood chemistry: Secondary | ICD-10-CM | POA: Diagnosis not present

## 2023-09-24 LAB — HEPATIC FUNCTION PANEL
ALT: 72 U/L — ABNORMAL HIGH (ref 0–53)
AST: 37 U/L (ref 0–37)
Albumin: 4.4 g/dL (ref 3.5–5.2)
Alkaline Phosphatase: 59 U/L (ref 39–117)
Bilirubin, Direct: 0.1 mg/dL (ref 0.0–0.3)
Total Bilirubin: 0.5 mg/dL (ref 0.2–1.2)
Total Protein: 6.9 g/dL (ref 6.0–8.3)

## 2023-09-25 ENCOUNTER — Ambulatory Visit: Payer: Self-pay | Admitting: Family Medicine

## 2023-09-25 NOTE — Progress Notes (Signed)
 Pt has been notified.

## 2023-10-11 ENCOUNTER — Encounter: Admitting: Family Medicine

## 2023-10-24 ENCOUNTER — Other Ambulatory Visit: Payer: Self-pay | Admitting: Family Medicine

## 2023-10-24 ENCOUNTER — Other Ambulatory Visit (HOSPITAL_COMMUNITY): Payer: Self-pay

## 2023-10-24 ENCOUNTER — Other Ambulatory Visit: Payer: Self-pay

## 2023-10-24 MED ORDER — NEBIVOLOL HCL 5 MG PO TABS
5.0000 mg | ORAL_TABLET | Freq: Every day | ORAL | 1 refills | Status: AC
  Filled 2023-10-24: qty 30, 30d supply, fill #0
  Filled 2023-11-23 – 2023-12-04 (×2): qty 30, 30d supply, fill #1
  Filled 2024-01-02: qty 30, 30d supply, fill #2
  Filled 2024-02-03: qty 30, 30d supply, fill #3

## 2023-10-24 NOTE — Telephone Encounter (Signed)
 Requested Prescriptions   Pending Prescriptions Disp Refills   nebivolol  (BYSTOLIC ) 5 MG tablet 90 tablet 1    Sig: Take 1 tablet (5 mg total) by mouth daily.     Date of patient request: 10/24/2023 Last office visit: 08/31/2023 Upcoming visit: Visit date not found Date of last refill: 05/03/2023 Last refill amount: 90

## 2023-12-03 ENCOUNTER — Other Ambulatory Visit (HOSPITAL_COMMUNITY): Payer: Self-pay

## 2023-12-06 ENCOUNTER — Other Ambulatory Visit: Payer: Self-pay | Admitting: Family Medicine

## 2023-12-10 ENCOUNTER — Other Ambulatory Visit (HOSPITAL_COMMUNITY): Payer: Self-pay

## 2023-12-11 ENCOUNTER — Other Ambulatory Visit (HOSPITAL_COMMUNITY): Payer: Self-pay

## 2023-12-11 MED ORDER — FLUOXETINE HCL 40 MG PO CAPS
40.0000 mg | ORAL_CAPSULE | Freq: Every day | ORAL | 3 refills | Status: AC
  Filled 2023-12-13: qty 30, 30d supply, fill #0
  Filled 2024-01-18: qty 30, 30d supply, fill #1

## 2023-12-14 ENCOUNTER — Other Ambulatory Visit (HOSPITAL_COMMUNITY): Payer: Self-pay

## 2023-12-14 ENCOUNTER — Other Ambulatory Visit: Payer: Self-pay

## 2023-12-17 ENCOUNTER — Encounter: Payer: Self-pay | Admitting: Family Medicine

## 2023-12-17 ENCOUNTER — Ambulatory Visit: Admitting: Family Medicine

## 2023-12-17 ENCOUNTER — Other Ambulatory Visit (HOSPITAL_COMMUNITY): Payer: Self-pay

## 2023-12-17 VITALS — BP 110/76 | HR 73 | Temp 98.0°F | Resp 17 | Ht 65.0 in | Wt 160.4 lb

## 2023-12-17 DIAGNOSIS — R6883 Chills (without fever): Secondary | ICD-10-CM

## 2023-12-17 DIAGNOSIS — J029 Acute pharyngitis, unspecified: Secondary | ICD-10-CM

## 2023-12-17 LAB — POCT INFLUENZA A/B
Influenza A, POC: NEGATIVE
Influenza B, POC: NEGATIVE

## 2023-12-17 LAB — POCT RAPID STREP A (OFFICE): Rapid Strep A Screen: NEGATIVE

## 2023-12-17 LAB — POC COVID19 BINAXNOW: SARS Coronavirus 2 Ag: NEGATIVE

## 2023-12-17 MED ORDER — AMOXICILLIN 500 MG PO CAPS
500.0000 mg | ORAL_CAPSULE | Freq: Two times a day (BID) | ORAL | 0 refills | Status: AC
  Filled 2023-12-17: qty 20, 10d supply, fill #0

## 2023-12-17 NOTE — Patient Instructions (Addendum)
 Thanks for coming in today.  Exam is overall reassuring but with the primary sore throat, I think it is reasonable to start amoxicillin  for possible false negative strep testing but I will check the throat culture.  Home COVID/flu testing in the next 24 to 48 hours and if one of those is positive can stop amoxicillin  at that time.  I will let you know once I receive the throat culture results or you may be able to see those through MyChart sooner.  Sore throat lozenges, fluids, rest, and let me know if there are questions.  Hope you are better soon!  Pharyngitis  Pharyngitis is inflammation of the throat (pharynx). It is a very common cause of sore throat. Pharyngitis can be caused by a bacteria, but it is usually caused by a virus. Most cases of pharyngitis get better on their own without treatment. What are the causes? This condition may be caused by: Infection by viruses (viral). Viral pharyngitis spreads easily from person to person (is contagious) through coughing, sneezing, and sharing of personal items or utensils such as cups, forks, spoons, and toothbrushes. Infection by bacteria (bacterial). Bacterial pharyngitis may be spread by touching the nose or face after coming in contact with the bacteria, or through close contact, such as kissing. Allergies. Allergies can cause buildup of mucus in the throat (post-nasal drip), leading to inflammation and irritation. Allergies can also cause blocked nasal passages, forcing breathing through the mouth, which dries and irritates the throat. What increases the risk? You are more likely to develop this condition if: You are 28-40 years old. You are exposed to crowded environments such as daycare, school, or dormitory living. You live in a cold climate. You have a weakened disease-fighting (immune) system. What are the signs or symptoms? Symptoms of this condition vary by the cause. Common symptoms of this condition include: Sore  throat. Fatigue. Low-grade fever. Stuffy nose (nasal congestion) and cough. Headache. Other symptoms may include: Glands in the neck (lymph nodes) that are swollen. Skin rashes. Plaque-like film on the throat or tonsils. This is often a symptom of bacterial pharyngitis. Vomiting. Red, itchy eyes (conjunctivitis). Loss of appetite. Joint pain and muscle aches. Enlarged tonsils. How is this diagnosed? This condition may be diagnosed based on your medical history and a physical exam. Your health care provider will ask you questions about your illness and your symptoms. A swab of your throat may be done to check for bacteria (rapid strep test). Other lab tests may also be done, depending on the suspected cause, but these are rare. How is this treated? Many times, treatment is not needed for this condition. Pharyngitis usually gets better in 3-4 days without treatment. Bacterial pharyngitis may be treated with antibiotic medicines. Follow these instructions at home: Medicines Take over-the-counter and prescription medicines only as told by your health care provider. If you were prescribed an antibiotic medicine, take it as told by your health care provider. Do not stop taking the antibiotic even if you start to feel better. Use throat sprays to soothe your throat as told by your health care provider. Children can get pharyngitis. Do not give your child aspirin  because of the association with Reye's syndrome. Managing pain To help with pain, try: Sipping warm liquids, such as broth, herbal tea, or warm water. Eating or drinking cold or frozen liquids, such as frozen ice pops. Gargling with a mixture of salt and water 3-4 times a day or as needed. To make salt water, completely dissolve -  1 tsp (3-6 g) of salt in 1 cup (237 mL) of warm water. Sucking on hard candy or throat lozenges. Putting a cool-mist humidifier in your bedroom at night to moisten the air. Sitting in the bathroom with the  door closed for 5-10 minutes while you run hot water in the shower.  General instructions  Do not use any products that contain nicotine or tobacco. These products include cigarettes, chewing tobacco, and vaping devices, such as e-cigarettes. If you need help quitting, ask your health care provider. Rest as told by your health care provider. Drink enough fluid to keep your urine pale yellow. How is this prevented? To help prevent becoming infected or spreading infection: Wash your hands often with soap and water for at least 20 seconds. If soap and water are not available, use hand sanitizer. Do not touch your eyes, nose, or mouth with unwashed hands, and wash hands after touching these areas. Do not share cups or eating utensils. Avoid close contact with people who are sick. Contact a health care provider if: You have large, tender lumps in your neck. You have a rash. You cough up green, yellow-brown, or bloody mucus. Get help right away if: Your neck becomes stiff. You drool or are unable to swallow liquids. You cannot drink or take medicines without vomiting. You have severe pain that does not go away, even after you take medicine. You have trouble breathing, and it is not caused by a stuffy nose. You have new pain and swelling in your joints such as the knees, ankles, wrists, or elbows. These symptoms may represent a serious problem that is an emergency. Do not wait to see if the symptoms will go away. Get medical help right away. Call your local emergency services (911 in the [place] Do not drive yourself to the hospital. Summary Pharyngitis is redness, pain, and swelling (inflammation) of the throat (pharynx). While pharyngitis can be caused by a bacteria, the most common causes are viral. Most cases of pharyngitis get better on their own without treatment. Bacterial pharyngitis is treated with antibiotic medicines. This information is not intended to replace advice given to you by  your health care provider. Make sure you discuss any questions you have with your health care provider. Document Revised: 03/24/2020 Document Reviewed: 03/24/2020 Elsevier Patient Education  2024 [employer].

## 2023-12-17 NOTE — Progress Notes (Signed)
 Subjective:  Patient ID: Miguel Love, male    DOB: Jun 01, 1990  Age: 33 y.o. MRN: [id number]  CC:  Chief Complaint  Patient presents with   Headache    Patient states he has headache that started last night. sore throat started today. Red patches in back of throat, no fever and suspects strep throat. Coughing to clear his throat. Chills. OTC fever reducers last night. Wife around thanksgiving got the flu. Dry congestion started today    HPI Miguel Love presents for   Acute visit for above.  PCP is Dr. Mahlon.  Headache,  sore throat Started with headache yesterday, usually does not have HA's. 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 More sore throat throughout the night. Saw some enlargement with tonsillith on right, extracted a few stones, still sore and other red patch today. Slight cough to clear throat only. Chills, no fever. Dry congestion in back of nose. No rhinorrhea.  Strep infection in past that escalated after initial negative rapid strep.   Sick contact with spouse with flu 2 weeks ago.   Tx: tylenol , able to drink fluids.   History Patient Active Problem List   Diagnosis Date Noted   Overweight (BMI 25.0-29.9) 08/31/2023   History of COVID-19 07/04/2019   IBS (irritable bowel syndrome) 07/20/2014   HTN (hypertension) 10/04/2012   General medical examination 06/29/2011   Childhood asthma 05/15/2011   Elevated urine levels of catecholamines 05/15/2011   Anxiety 05/15/2011   Past Medical History:  Diagnosis Date   Anxiety    Asthma    Cardiac arrhythmia due to congenital heart disease    pt denies 12/25/12   Gallbladder polyp    Hypertension    Palpitations    Past Surgical History:  Procedure Laterality Date   ANKLE ARTHROSCOPY WITH OPEN REDUCTION INTERNAL FIXATION (ORIF) Right 02/24/2020   Procedure: RIGHT ANKLE ARTHROSCOPIC DEBRIDEMENT, OPEN TREATMENT OF SYNDESMOSIS;  Surgeon: Elsa Lonni SAUNDERS, MD;  Location: [facility] OR;  Service: Orthopedics;   Laterality: Right;  LENGTH OF SURGERY: 2 HOURS   WISDOM TOOTH EXTRACTION  2016   Allergies  Allergen Reactions   Nsaids Shortness Of Breath    Asthma attack   Prior to Admission medications   Medication Sig Start Date End Date Taking? Authorizing Provider  albuterol  (VENTOLIN  HFA) 108 (90 Base) MCG/ACT inhaler Inhale 2 puffs into the lungs every 6 (six) hours as needed for wheezing or shortness of breath. 08/31/23  Yes Mahlon Comer BRAVO, MD  FLUoxetine  (PROZAC ) 40 MG capsule Take 1 capsule (40 mg total) by mouth daily. 12/11/23  Yes Tabori, Katherine E, MD  fluticasone  (FLONASE ) 50 MCG/ACT nasal spray PLACE 2 SPRAYS INTO BOTH NOSTRILS DAILY 08/31/23  Yes Tabori, Katherine E, MD  nebivolol  (BYSTOLIC ) 5 MG tablet Take 1 tablet (5 mg total) by mouth daily. 10/24/23  Yes Tabori, Katherine E, MD  zinc gluconate 50 MG tablet Take 50 mg by mouth daily.   Yes [provider]   Social History   Socioeconomic History   Marital status: Single    Spouse name: Not on file   Number of children: 0   Years of education: Not on file   Highest education level: Not on file  Occupational History    Employer: [employer]  Tobacco Use   Smoking status: Never   Smokeless tobacco: Never  Vaping Use   Vaping status: Never Used  Substance and Sexual Activity   Alcohol use: Yes    Comment:  occassional   Drug use: No   Sexual activity: Yes  Other Topics Concern   Not on file  Social History Narrative   Not on file   Social Drivers of Health   Financial Resource Strain: Not on file  Food Insecurity: Not on file  Transportation Needs: Not on file  Physical Activity: Not on file  Stress: Not on file  Social Connections: Not on file  Intimate Partner Violence: Not on file    Review of Systems Per HPI.   Objective:   Vitals:   12/17/23 1359  BP: 110/76  Pulse: 73  Resp: 17  Temp: 98 F (36.7 C)  TempSrc: Temporal  SpO2: 98%  Weight: 160 lb 6.4 oz (72.8 kg)  Height: 5' 5  (1.651 m)    Physical Exam Vitals reviewed.  Constitutional:      Appearance: He is well-developed.  HENT:     Head: Normocephalic and atraumatic.     Right Ear: Tympanic membrane, ear canal and external ear normal.     Left Ear: Tympanic membrane, ear canal and external ear normal.     Nose: No rhinorrhea.     Mouth/Throat:     Mouth: Mucous membranes are moist.     Pharynx: No oropharyngeal exudate or posterior oropharyngeal erythema.     Comments: Faint erythema at the posterior OP, no tonsilar exudate.  Eyes:     Conjunctiva/sclera: Conjunctivae normal.     Pupils: Pupils are equal, round, and reactive to light.  Cardiovascular:     Rate and Rhythm: Normal rate and regular rhythm.     Heart sounds: Normal heart sounds. No murmur heard. Pulmonary:     Effort: Pulmonary effort is normal.     Breath sounds: Normal breath sounds. No wheezing, rhonchi or rales.  Abdominal:     Palpations: Abdomen is soft.     Tenderness: There is no abdominal tenderness.  Musculoskeletal:     Cervical back: Neck supple.  Lymphadenopathy:     Cervical: No cervical adenopathy.  Skin:    General: Skin is warm and dry.     Findings: No rash.  Neurological:     Mental Status: He is alert and oriented to person, place, and time.  Psychiatric:        Behavior: Behavior normal.    Results for orders placed or performed in visit on 12/17/23  POCT rapid strep A   Collection Time: 12/17/23  2:26 PM  Result Value Ref Range   Rapid Strep A Screen Negative Negative  POC COVID-19 BinaxNow   Collection Time: 12/17/23  2:26 PM  Result Value Ref Range   SARS Coronavirus 2 Ag Negative Negative  POCT Influenza A/B   Collection Time: 12/17/23  2:26 PM  Result Value Ref Range   Influenza A, POC Negative Negative   Influenza B, POC Negative Negative       Assessment & Plan:  Miguel Love is a 33 y.o. male . Sore throat - Plan: POCT rapid strep A, amoxicillin  (AMOXIL ) 500 MG capsule  Chills -  Plan: POC COVID-19 BinaxNow, POCT Influenza A/B, amoxicillin  (AMOXIL ) 500 MG capsule  Pharyngitis, unspecified etiology - Plan: amoxicillin  (AMOXIL ) 500 MG capsule, Culture, Group A Strep  Possible viral illness versus early strep pharyngitis with false negative rapid strep, has experienced similar symptoms previously with strep.  Check throat culture, start amoxicillin  for now, check home COVID, flu testing in the next 24 to 48 hours as still early in symptoms, potential  false negatives here in office.  Symptomatic care with RTC precautions given.  Meds ordered this encounter  Medications   amoxicillin  (AMOXIL ) 500 MG capsule    Sig: Take 1 capsule (500 mg total) by mouth 2 (two) times daily for 10 days.    Dispense:  20 capsule    Refill:  0   Patient Instructions  Thanks for coming in today.  Exam is overall reassuring but with the primary sore throat, I think it is reasonable to start amoxicillin  for possible false negative strep testing but I will check the throat culture.  Home COVID/flu testing in the next 24 to 48 hours and if one of those is positive can stop amoxicillin  at that time.  I will let you know once I receive the throat culture results or you may be able to see those through MyChart sooner.  Sore throat lozenges, fluids, rest, and let me know if there are questions.  Hope you are better soon!  Pharyngitis  Pharyngitis is inflammation of the throat (pharynx). It is a very common cause of sore throat. Pharyngitis can be caused by a bacteria, but it is usually caused by a virus. Most cases of pharyngitis get better on their own without treatment. What are the causes? This condition may be caused by: Infection by viruses (viral). Viral pharyngitis spreads easily from person to person (is contagious) through coughing, sneezing, and sharing of personal items or utensils such as cups, forks, spoons, and toothbrushes. Infection by bacteria (bacterial). Bacterial pharyngitis may be  spread by touching the nose or face after coming in contact with the bacteria, or through close contact, such as kissing. Allergies. Allergies can cause buildup of mucus in the throat (post-nasal drip), leading to inflammation and irritation. Allergies can also cause blocked nasal passages, forcing breathing through the mouth, which dries and irritates the throat. What increases the risk? You are more likely to develop this condition if: You are 25-61 years old. You are exposed to crowded environments such as daycare, school, or dormitory living. You live in a cold climate. You have a weakened disease-fighting (immune) system. What are the signs or symptoms? Symptoms of this condition vary by the cause. Common symptoms of this condition include: Sore throat. Fatigue. Low-grade fever. Stuffy nose (nasal congestion) and cough. Headache. Other symptoms may include: Glands in the neck (lymph nodes) that are swollen. Skin rashes. Plaque-like film on the throat or tonsils. This is often a symptom of bacterial pharyngitis. Vomiting. Red, itchy eyes (conjunctivitis). Loss of appetite. Joint pain and muscle aches. Enlarged tonsils. How is this diagnosed? This condition may be diagnosed based on your medical history and a physical exam. Your health care provider will ask you questions about your illness and your symptoms. A swab of your throat may be done to check for bacteria (rapid strep test). Other lab tests may also be done, depending on the suspected cause, but these are rare. How is this treated? Many times, treatment is not needed for this condition. Pharyngitis usually gets better in 3-4 days without treatment. Bacterial pharyngitis may be treated with antibiotic medicines. Follow these instructions at home: Medicines Take over-the-counter and prescription medicines only as told by your health care provider. If you were prescribed an antibiotic medicine, take it as told by your health  care provider. Do not stop taking the antibiotic even if you start to feel better. Use throat sprays to soothe your throat as told by your health care provider. Children can get pharyngitis.  Do not give your child aspirin  because of the association with Reye's syndrome. Managing pain To help with pain, try: Sipping warm liquids, such as broth, herbal tea, or warm water. Eating or drinking cold or frozen liquids, such as frozen ice pops. Gargling with a mixture of salt and water 3-4 times a day or as needed. To make salt water, completely dissolve -1 tsp (3-6 g) of salt in 1 cup (237 mL) of warm water. Sucking on hard candy or throat lozenges. Putting a cool-mist humidifier in your bedroom at night to moisten the air. Sitting in the bathroom with the door closed for 5-10 minutes while you run hot water in the shower.  General instructions  Do not use any products that contain nicotine or tobacco. These products include cigarettes, chewing tobacco, and vaping devices, such as e-cigarettes. If you need help quitting, ask your health care provider. Rest as told by your health care provider. Drink enough fluid to keep your urine pale yellow. How is this prevented? To help prevent becoming infected or spreading infection: Wash your hands often with soap and water for at least 20 seconds. If soap and water are not available, use hand sanitizer. Do not touch your eyes, nose, or mouth with unwashed hands, and wash hands after touching these areas. Do not share cups or eating utensils. Avoid close contact with people who are sick. Contact a health care provider if: You have large, tender lumps in your neck. You have a rash. You cough up green, yellow-brown, or bloody mucus. Get help right away if: Your neck becomes stiff. You drool or are unable to swallow liquids. You cannot drink or take medicines without vomiting. You have severe pain that does not go away, even after you take medicine. You  have trouble breathing, and it is not caused by a stuffy nose. You have new pain and swelling in your joints such as the knees, ankles, wrists, or elbows. These symptoms may represent a serious problem that is an emergency. Do not wait to see if the symptoms will go away. Get medical help right away. Call your local emergency services (911 in the [place] Do not drive yourself to the hospital. Summary Pharyngitis is redness, pain, and swelling (inflammation) of the throat (pharynx). While pharyngitis can be caused by a bacteria, the most common causes are viral. Most cases of pharyngitis get better on their own without treatment. Bacterial pharyngitis is treated with antibiotic medicines. This information is not intended to replace advice given to you by your health care provider. Make sure you discuss any questions you have with your health care provider. Document Revised: 03/24/2020 Document Reviewed: 03/24/2020 Elsevier Patient Education  2024 [employer]    Signed,   Reyes Pines, MD [facility], [facility] 12/17/23 2:50 PM

## 2023-12-19 ENCOUNTER — Ambulatory Visit: Payer: Self-pay | Admitting: Family Medicine

## 2023-12-19 LAB — CULTURE, GROUP A STREP
Micro Number: 17326273
SPECIMEN QUALITY:: ADEQUATE

## 2023-12-20 NOTE — Telephone Encounter (Signed)
 Patient sent you an update

## 2024-04-01 ENCOUNTER — Ambulatory Visit: Admitting: Dermatology
